# Patient Record
Sex: Female | Born: 2000
Health system: Southern US, Community
[De-identification: ages and names within clinical notes are randomized; demographics above are authoritative.]

## PROBLEM LIST (undated history)

## (undated) DIAGNOSIS — E119 Type 2 diabetes mellitus without complications: Secondary | ICD-10-CM

## (undated) DIAGNOSIS — L309 Dermatitis, unspecified: Secondary | ICD-10-CM

## (undated) HISTORY — PX: WISDOM TOOTH EXTRACTION: SHX21

## (undated) HISTORY — DX: Type 2 diabetes mellitus without complications: E11.9

---
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
## 2019-01-03 ENCOUNTER — Encounter (INDEPENDENT_AMBULATORY_CARE_PROVIDER_SITE_OTHER): Payer: Self-pay

## 2019-02-06 ENCOUNTER — Other Ambulatory Visit: Payer: Self-pay | Admitting: Family

## 2019-03-05 ENCOUNTER — Ambulatory Visit (INDEPENDENT_AMBULATORY_CARE_PROVIDER_SITE_OTHER): Payer: Managed Care, Other (non HMO) | Admitting: Family

## 2019-03-12 ENCOUNTER — Other Ambulatory Visit: Payer: Self-pay

## 2019-03-12 ENCOUNTER — Encounter (INDEPENDENT_AMBULATORY_CARE_PROVIDER_SITE_OTHER): Payer: Self-pay | Admitting: Family

## 2019-03-12 ENCOUNTER — Ambulatory Visit (INDEPENDENT_AMBULATORY_CARE_PROVIDER_SITE_OTHER): Payer: Managed Care, Other (non HMO) | Admitting: Family

## 2019-03-12 VITALS — Ht 58.54 in | Wt 112.2 lb

## 2019-03-12 DIAGNOSIS — E109 Type 1 diabetes mellitus without complications: Secondary | ICD-10-CM | POA: Diagnosis not present

## 2019-03-12 DIAGNOSIS — E10649 Type 1 diabetes mellitus with hypoglycemia without coma: Secondary | ICD-10-CM

## 2019-03-12 DIAGNOSIS — F4320 Adjustment disorder, unspecified: Secondary | ICD-10-CM | POA: Diagnosis not present

## 2019-03-12 DIAGNOSIS — R739 Hyperglycemia, unspecified: Secondary | ICD-10-CM

## 2019-03-12 LAB — POCT GLYCOSYLATED HEMOGLOBIN (HGB A1C): Hemoglobin A1C: 6.7 % — AB (ref 4.0–5.6)

## 2019-03-12 LAB — POCT GLUCOSE (DEVICE FOR HOME USE): POC Glucose: 143 mg/dl — AB (ref 70–99)

## 2019-03-12 NOTE — Patient Instructions (Signed)
-  Always have fast sugar with you in case of low blood sugar (glucose tabs, regular juice or soda, candy) -Always wear your ID that states you have diabetes -Always bring your meter to your visit -Call/Email if you want to review blood sugars   

## 2019-03-12 NOTE — Progress Notes (Signed)
Pediatric Endocrinology Diabetes Consultation Follow-up Visit  [name] 2001/05/27 [id number]  Chief Complaint: Follow-up type 1 diabetes   Patient, No Pcp Per   HPI: [name]  is a 18 y.o. female presenting for follow-up of type 1 diabetes. she is accompanied to this visit by her mother and father.  [place] presented to Urgent care on 03/19/15 after having vomiting. She had a 2-3 week history of polyuria and polydipsia. She was also more tired for weeks prior to her admission and had been losing weight. She was transferred to [facility] with a glucose of >600 and pH of 6.895. She was treated with the 2 bag method. She has no other family members with type 1 diabetes. Mom has thyroid disease. She was discharged on 03/23/15 on Lantus and Novolog. Her pancreatic islet cell antibodies were positive.   2. Since last visit to [facility] on 10/2018, she has been well.  No ER visits or hospitalizations.  She has been busy, she recently got wisdom teeth taken out and did well. She has been thinking a lot about getting insulin pump, trying to decide between Omnipod and Tslim. She is very happy with her Dexcom, she thinks her blood sugar have been really good. She recently started having low blood sugars around 1-3am. She does feel her low blood sugars.   During discussing hypoglycemia, she reports that she usually gives "extra insulin" at night when her blood sugars are high, she is not waiting 3 hours for the insulin to work.   Insulin regimen: 23 units of Basaglar. Humalog  120/30/8 plan  Hypoglycemia:Able to feel low blood sugars.  No glucagon needed recently.  Blood glucose download:  Dexcom CGM Download   - Avg Bg 143  - Target Range; in target 72%, above target 24% and below target 1%   - Pattern of hyperglycemia with rebound hypoglycemia between 8pm-3am.  Med-alert ID: Not currently wearing. Injection sites: arms and abdomen.  Annual labs due: 05/2019 Ophthalmology due: 2019--> Discussed  importance of dilated eye exam today.     3. ROS: Greater than 10 systems reviewed with pertinent positives listed in HPI, otherwise neg. Constitutional: Sleeping well. Weight stable.  Eyes: No changes in vision. She is due for eye exam. No blurry vision.  Ears/Nose/Mouth/Throat: No difficulty swallowing. Cardiovascular: No palpitations. No chest pain  Respiratory: No increased work of breathing Gastrointestinal: No constipation or diarrhea. No abdominal pain Genitourinary: No nocturia, no polyuria Neurologic: Normal sensation, no tremor Endocrine: No polydipsia.  No hyperpigmentation Psychiatric: Normal affect. Denies anxiety and depression.   Past Medical History:   Past Medical History:  Diagnosis Date  . Diabetes mellitus without complication ([place])   . Eczema     Medications:  Outpatient Encounter Medications as of 03/12/2019  Medication Sig  . acetone, urine, test strip 1 strip by Does not apply route as needed for high blood sugar.  . BD PEN NEEDLE NANO U/F 32G X 4 MM MISC USE TO INJECT INSULIN AS DIRECTED.  Marland Kitchen Blood Glucose Monitoring Suppl (ONETOUCH VERIO FLEX SYSTEM) w/Device KIT Use to check glucose  . cetirizine (ZYRTEC) 10 MG tablet Take 10 mg by mouth daily as needed for allergies. Reported on 07/22/2015  . glucagon (GLUCAGON EMERGENCY) 1 MG injection Inject 1 mg into the vein once as needed.  . Insulin Glargine (BASAGLAR KWIKPEN) 100 UNIT/ML SOPN INJECT UP TO 50 UNITS UNDER THE SKIN DAILY AND AS DIRECTED (90 day supply)  . insulin lispro (HUMALOG KWIKPEN) 100 UNIT/ML KwikPen INJECT UP  TO 50 UNITS PER DAY  . ONE TOUCH LANCETS MISC Use to check blood sugar up to six times a day.  [name] VERIO test strip CHECK BLOOD SUGAR 6 TIMES DAILY.  Marland Kitchen insulin glargine (LANTUS) 100 UNIT/ML injection Inject into the skin at bedtime.  . phenazopyridine (PYRIDIUM) 95 MG tablet Take 95 mg by mouth 3 (three) times daily as needed for pain.  . Vitamin D, Ergocalciferol, (DRISDOL) 50000  units CAPS capsule TAKE 1 CAPSULE EVERY WEEK BY MOUTH AS DIRECTED FOR 56 DAYS.   No facility-administered encounter medications on file as of 03/12/2019.     Allergies: Allergies  Allergen Reactions  . Apple Fiber Anaphylaxis    Swelling of face and throat when eats apple peelings  . Cherry     Throat and lip swelling     Surgical History: No past surgical history on file.  Family History:  Family History  Problem Relation Age of Onset  . Graves' disease Mother   . Hypertension Mother   . Hypertension Father       Social History: Lives with: Mother and father  Starting Cosmetology school in the Spring   Physical Exam:  Vitals:   03/12/19 1333  Weight: 112 lb 3.2 oz (50.9 kg)  Height: 4' 10.54" (1.487 m)   Ht 4' 10.54" (1.487 m)   Wt 112 lb 3.2 oz (50.9 kg)   LMP 03/05/2019 (Exact Date)   BMI 23.02 kg/m  Body mass index: body mass index is 23.02 kg/m. Blood pressure percentiles are not available for patients who are 18 years or older.  Ht Readings from Last 3 Encounters:  03/12/19 4' 10.54" (1.487 m) (1 %, Z= -2.25)*  11/27/18 4' 10.54" (1.487 m) (1 %, Z= -2.24)*  08/14/18 4' 10.54" (1.487 m) (1 %, Z= -2.24)*   * Growth percentiles are based on CDC (Girls, 2-20 Years) data.   Wt Readings from Last 3 Encounters:  03/12/19 112 lb 3.2 oz (50.9 kg) (19 %, Z= -0.87)*  11/27/18 112 lb (50.8 kg) (20 %, Z= -0.86)*  08/14/18 112 lb (50.8 kg) (20 %, Z= -0.82)*   * Growth percentiles are based on CDC (Girls, 2-20 Years) data.    Physical Exam.   General: Well developed, well nourished female in no acute distress.  Alert and oriented.  Head: Normocephalic, atraumatic.   Eyes:  Pupils equal and round. EOMI.   Sclera white.  No eye drainage.   Ears/Nose/Mouth/Throat: Nares patent, no nasal drainage.  Normal dentition, mucous membranes moist.   Neck: supple, no cervical lymphadenopathy, no thyromegaly Cardiovascular: regular rate, normal S1/S2, no  murmurs Respiratory: No increased work of breathing.  Lungs clear to auscultation bilaterally.  No wheezes. Abdomen: soft, nontender, nondistended. Normal bowel sounds.  No appreciable masses  Extremities: warm, well perfused, cap refill < 2 sec.   Musculoskeletal: Normal muscle mass.  Normal strength Skin: warm, dry.  No rash or lesions. Neurologic: alert and oriented, normal speech, no tremor   Labs:  Lab Results  Component Value Date   HGBA1C 6.7 (A) 03/12/2019   Results for orders placed or performed in visit on 03/12/19  POCT Glucose (Device for Home Use)  Result Value Ref Range   Glucose Fasting, POC     POC Glucose 143 (A) 70 - 99 mg/dl  POCT glycosylated hemoglobin (Hb A1C)  Result Value Ref Range   Hemoglobin A1C 6.7 (A) 4.0 - 5.6 %   HbA1c POC (<> result, manual entry)  HbA1c, POC (prediabetic range)     HbA1c, POC (controlled diabetic range)      Assessment/Plan: [name] is a 18 y.o. female with type 1 diabetes in improving control on CGM and MDI therapy. She is doing very well with diabetes care. Having rebound hypoglycemia due to stacking insulin at night, discussed in detail today. Would benefit from pump therapy. Hemoglobin A1c is 6.7% which meets ADA goal of <7%.  1-3. DM w/o complication type I, uncontrolled (HCC)/HyperglycemiaI/hypoglycemia/Elevated A1c .   - Decrease Basaglar to 23 units  - Humalog 120/30/8 plan with + 1 unit at dinner.  - Reviewed  CGM download. Discussed trends and patterns.  - Rotate injection sites to prevent scar tissue.  - bolus 15 minutes prior to eating to limit blood sugar spikes.  - Reviewed carb counting and importance of accurate carb counting.  - Discussed signs and symptoms of hypoglycemia. Always have glucose available.  - POCT glucose and hemoglobin A1c  - Reviewed growth chart.  - Discussed not to "stack" insulin. Rapid acting insulin takes 3-4 hours to exit system.    4. Adjustment Reaction  - Discussed concerns and  barrier to care - Answered questions.   Follow-up:   3 months.   I have spent >40 minutes with >50% of time in counseling, education and instruction. When a patient is on insulin, intensive monitoring of blood glucose levels is necessary to avoid hyperglycemia and hypoglycemia. Severe hyperglycemia/hypoglycemia can lead to hospital admissions and be life threatening.     Hermenia Bers,  FNP-C  Pediatric Specialist  [address] [place]  [place], [address]  Tele: [phone]

## 2019-03-13 ENCOUNTER — Other Ambulatory Visit (INDEPENDENT_AMBULATORY_CARE_PROVIDER_SITE_OTHER): Payer: Self-pay | Admitting: Family

## 2019-04-09 ENCOUNTER — Encounter (INDEPENDENT_AMBULATORY_CARE_PROVIDER_SITE_OTHER): Payer: Self-pay

## 2019-05-03 ENCOUNTER — Encounter (INDEPENDENT_AMBULATORY_CARE_PROVIDER_SITE_OTHER): Payer: Self-pay

## 2019-05-21 ENCOUNTER — Encounter (INDEPENDENT_AMBULATORY_CARE_PROVIDER_SITE_OTHER): Payer: Self-pay | Admitting: Family

## 2019-06-10 ENCOUNTER — Encounter (INDEPENDENT_AMBULATORY_CARE_PROVIDER_SITE_OTHER): Payer: Self-pay

## 2019-06-18 ENCOUNTER — Ambulatory Visit (INDEPENDENT_AMBULATORY_CARE_PROVIDER_SITE_OTHER): Payer: Managed Care, Other (non HMO) | Admitting: Family

## 2019-07-30 ENCOUNTER — Encounter (INDEPENDENT_AMBULATORY_CARE_PROVIDER_SITE_OTHER): Payer: Self-pay | Admitting: Family

## 2019-07-30 ENCOUNTER — Ambulatory Visit (INDEPENDENT_AMBULATORY_CARE_PROVIDER_SITE_OTHER): Payer: BC Managed Care – PPO | Admitting: Family

## 2019-07-30 VITALS — BP 118/64 | HR 88 | Ht 58.74 in | Wt 116.8 lb

## 2019-07-30 DIAGNOSIS — E559 Vitamin D deficiency, unspecified: Secondary | ICD-10-CM

## 2019-07-30 DIAGNOSIS — Z794 Long term (current) use of insulin: Secondary | ICD-10-CM | POA: Diagnosis not present

## 2019-07-30 DIAGNOSIS — E109 Type 1 diabetes mellitus without complications: Secondary | ICD-10-CM | POA: Diagnosis not present

## 2019-07-30 DIAGNOSIS — E10649 Type 1 diabetes mellitus with hypoglycemia without coma: Secondary | ICD-10-CM

## 2019-07-30 DIAGNOSIS — R739 Hyperglycemia, unspecified: Secondary | ICD-10-CM

## 2019-07-30 LAB — POCT GLYCOSYLATED HEMOGLOBIN (HGB A1C): Hemoglobin A1C: 6.8 % — AB (ref 4.0–5.6)

## 2019-07-30 LAB — POCT GLUCOSE (DEVICE FOR HOME USE): POC Glucose: 264 mg/dl — AB (ref 70–99)

## 2019-07-30 MED ORDER — GVOKE HYPOPEN 2-PACK 1 MG/0.2ML ~~LOC~~ SOAJ: 1.0000 | SUBCUTANEOUS | 1 refills | Status: DC | PRN

## 2019-07-30 NOTE — Progress Notes (Signed)
Pediatric Endocrinology Diabetes Consultation Follow-up Visit  [place] Territo 04-Sep-2000 [id number]  Chief Complaint: Follow-up type 1 diabetes   Patient, No Pcp Per   HPI: [place]  is a 19 y.o. female presenting for follow-up of type 1 diabetes. she is accompanied to this visit by her mother and father.  [place] presented to Urgent care on 03/19/15 after having vomiting. She had a 2-3 week history of polyuria and polydipsia. She was also more tired for weeks prior to her admission and had been losing weight. She was transferred to [facility] with a glucose of >600 and pH of 6.895. She was treated with the 2 bag method. She has no other family members with type 1 diabetes. Mom has thyroid disease. She was discharged on 03/23/15 on Lantus and Novolog. Her pancreatic islet cell antibodies were positive.   2. Since last visit to [facility] on 03/2019, she has been well.  No ER visits or hospitalizations.  She is working at [employer] for almost 40 hours per week, it is going well. She is deciding where she wants to go to college. Using Dexcom CGM and MDI. She reports that it has been "really easy" with Dexcom.   Concerns:  - lows at night if she does not eat when getting home from work  -  Insulin regimen: 21 units of Basaglar. Humalog  120/30/8 plan  Hypoglycemia:Able to feel low blood sugars.  No glucagon needed recently.  Blood glucose download:  Dexcom CGM Download   - Avg Bg 162.   - Target range. In target 61%, above target 35% and below target 3%   - pattern of hypoglycemia between 1am-6am.  Med-alert ID: Not currently wearing. Injection sites: arms and abdomen.  Annual labs due: 05/2019 Ophthalmology due: 2019--> Discussed importance of dilated eye exam today.     3. ROS: Greater than 10 systems reviewed with pertinent positives listed in HPI, otherwise neg. Constitutional: Sleeping well. Weight stable.  Eyes: No changes in vision. She is due for eye exam. No blurry vision.   Ears/Nose/Mouth/Throat: No difficulty swallowing. Cardiovascular: No palpitations. No chest pain  Respiratory: No increased work of breathing Gastrointestinal: No constipation or diarrhea. No abdominal pain Genitourinary: No nocturia, no polyuria Neurologic: Normal sensation, no tremor Endocrine: No polydipsia.  No hyperpigmentation Psychiatric: Normal affect. Denies anxiety and depression.   Past Medical History:   Past Medical History:  Diagnosis Date  . Diabetes mellitus without complication ([place])   . Eczema     Medications:  Outpatient Encounter Medications as of 07/30/2019  Medication Sig  . acetone, urine, test strip 1 strip by Does not apply route as needed for high blood sugar.  . BD PEN NEEDLE NANO U/F 32G X 4 MM MISC USE TO INJECT INSULIN AS DIRECTED.  Marland Kitchen Blood Glucose Monitoring Suppl (ONETOUCH VERIO FLEX SYSTEM) w/Device KIT Use to check glucose  . cetirizine (ZYRTEC) 10 MG tablet Take 10 mg by mouth daily as needed for allergies. Reported on 07/22/2015  . glucagon (GLUCAGON EMERGENCY) 1 MG injection Inject 1 mg into the vein once as needed.  . Insulin Glargine (BASAGLAR KWIKPEN) 100 UNIT/ML SOPN INJECT UP TO 50 UNITS UNDER THE SKIN DAILY AND AS DIRECTED (90 day supply)  . insulin glargine (LANTUS) 100 UNIT/ML injection Inject into the skin at bedtime.  . insulin lispro (HUMALOG KWIKPEN) 100 UNIT/ML KwikPen INJECT UP TO 50 UNITS PER DAY  . ONE TOUCH LANCETS MISC Use to check blood sugar up to six times a day.  Marland Kitchen  ONETOUCH VERIO test strip CHECK BLOOD SUGAR 6 TIMES DAILY.  Marland Kitchen Glucagon (GVOKE HYPOPEN 2-PACK) 1 MG/0.2ML SOAJ Inject 1 Dose into the skin as needed.  . phenazopyridine (PYRIDIUM) 95 MG tablet Take 95 mg by mouth 3 (three) times daily as needed for pain.  . Vitamin D, Ergocalciferol, (DRISDOL) 50000 units CAPS capsule TAKE 1 CAPSULE EVERY WEEK BY MOUTH AS DIRECTED FOR 56 DAYS.   No facility-administered encounter medications on file as of 07/30/2019.     Allergies: Allergies  Allergen Reactions  . Apple Fiber Anaphylaxis    Swelling of face and throat when eats apple peelings  . Cherry     Throat and lip swelling     Surgical History: Past Surgical History:  Procedure Laterality Date  . WISDOM TOOTH EXTRACTION      Family History:  Family History  Problem Relation Age of Onset  . Graves' disease Mother   . Hypertension Mother   . Hypertension Father       Social History: Lives with: Mother and father  Starting Cosmetology school in the Spring   Physical Exam:  Vitals:   07/30/19 1053  BP: 118/64  Pulse: 88  Weight: 116 lb 12.8 oz (53 kg)  Height: 4' 10.74" (1.492 m)   BP 118/64   Pulse 88   Ht 4' 10.74" (1.492 m)   Wt 116 lb 12.8 oz (53 kg)   BMI 23.80 kg/m  Body mass index: body mass index is 23.8 kg/m. Blood pressure percentiles are not available for patients who are 18 years or older.  Ht Readings from Last 3 Encounters:  07/30/19 4' 10.74" (1.492 m) (1 %, Z= -2.17)*  03/12/19 4' 10.54" (1.487 m) (1 %, Z= -2.25)*  11/27/18 4' 10.54" (1.487 m) (1 %, Z= -2.24)*   * Growth percentiles are based on CDC (Girls, 2-20 Years) data.   Wt Readings from Last 3 Encounters:  07/30/19 116 lb 12.8 oz (53 kg) (27 %, Z= -0.61)*  03/12/19 112 lb 3.2 oz (50.9 kg) (19 %, Z= -0.87)*  11/27/18 112 lb (50.8 kg) (20 %, Z= -0.86)*   * Growth percentiles are based on CDC (Girls, 2-20 Years) data.    Physical Exam.   General: Well developed, well nourished female in no acute distress.  Alert and oriented.  Head: Normocephalic, atraumatic.   Eyes:  Pupils equal and round. EOMI.   Sclera white.  No eye drainage.   Ears/Nose/Mouth/Throat: Nares patent, no nasal drainage.  Normal dentition, mucous membranes moist.   Neck: supple, no cervical lymphadenopathy, no thyromegaly Cardiovascular: regular rate, normal S1/S2, no murmurs Respiratory: No increased work of breathing.  Lungs clear to auscultation bilaterally.  No  wheezes. Abdomen: soft, nontender, nondistended. Normal bowel sounds.  No appreciable masses  Extremities: warm, well perfused, cap refill < 2 sec.   Musculoskeletal: Normal muscle mass.  Normal strength Skin: warm, dry.  No rash or lesions. Neurologic: alert and oriented, normal speech, no tremor    Labs:  Lab Results  Component Value Date   HGBA1C 6.8 (A) 07/30/2019   Results for orders placed or performed in visit on 07/30/19  POCT glycosylated hemoglobin (Hb A1C)  Result Value Ref Range   Hemoglobin A1C 6.8 (A) 4.0 - 5.6 %   HbA1c POC (<> result, manual entry)     HbA1c, POC (prediabetic range)     HbA1c, POC (controlled diabetic range)    POCT Glucose (Device for Home Use)  Result Value Ref Range   Glucose  Fasting, POC     POC Glucose 264 (A) 70 - 99 mg/dl    Assessment/Plan: Jaretzi is a 19 y.o. female with type 1 diabetes in improving control on CGM and MDI therapy. She is having pattern of hypoglycemia overnight, will decrease basaglar dose. Hemoglobin A1c is 6.8%which meets ADA goal of <7%.   1-3. DM w/o complication type I, uncontrolled (HCC)/HyperglycemiaI/hypoglycemia/Elevated A1c .   - Decrease Basaglar to 19 units  - Humalog 120/30/8 plan with + 1 unit at dinner.  - Reviewed meter and CGM download. Discussed trends and patterns.  - Rotate injection sites to prevent scar tissue.  - Inject Novolog 15 minutes prior to eating to limit blood sugar spikes.  - Reviewed carb counting and importance of accurate carb counting.  - Discussed signs and symptoms of hypoglycemia. Always have glucose available.  - POCT glucose and hemoglobin A1c  - Reviewed growth chart.  - Discussed current and future insulin pump technology  - Lipid panel, TFTs and Microalbumin ordered  - GVOKE hypoglycemia kit ordered.   4. Adjustment Reaction  - Discussed concerns and barriers to care  - Answered questions.   5. Hypovitaminosis D  - Repeat 25 OH D level.  Follow-up:   3 months.    >45 spent today reviewing the medical chart, counseling the patient/family, and documenting today's visit.  When a patient is on insulin, intensive monitoring of blood glucose levels is necessary to avoid hyperglycemia and hypoglycemia. Severe hyperglycemia/hypoglycemia can lead to hospital admissions and be life threatening.     Hermenia Bers,  FNP-C  Pediatric Specialist  [address] [place]  [place], [address]  Tele: [phone]

## 2019-07-30 NOTE — Patient Instructions (Signed)
Decrease basaglar to 19 units   - Can go back up to 20 units if running higher   - Humalog log per plan   - -Always have fast sugar with you in case of low blood sugar (glucose tabs, regular juice or soda, candy) -Always wear your ID that states you have diabetes -Always bring your meter to your visit -Call/Email if you want to review blood sugars

## 2019-07-31 ENCOUNTER — Other Ambulatory Visit (INDEPENDENT_AMBULATORY_CARE_PROVIDER_SITE_OTHER): Payer: Self-pay | Admitting: Family

## 2019-07-31 LAB — T4, FREE: Free T4: 1 ng/dL (ref 0.8–1.4)

## 2019-07-31 LAB — TSH: TSH: 1.65 mIU/L

## 2019-07-31 LAB — LIPID PANEL
Cholesterol: 192 mg/dL (ref <200)
HDL: 60 mg/dL (ref >=50)
LDL Cholesterol (Calc): 108 mg/dL (calc) — ABNORMAL HIGH
Non-HDL Cholesterol (Calc): 132 mg/dL (calc) — ABNORMAL HIGH (ref <130)
Total CHOL/HDL Ratio: 3.2 (calc) (ref <5.0)
Triglycerides: 127 mg/dL (ref <150)

## 2019-07-31 LAB — MICROALBUMIN / CREATININE URINE RATIO
Creatinine, Urine: 150 mg/dL (ref 20–275)
Microalb Creat Ratio: 1 mcg/mg creat (ref <30)
Microalb, Ur: 0.2 mg/dL

## 2019-07-31 LAB — VITAMIN D 25 HYDROXY (VIT D DEFICIENCY, FRACTURES): Vit D, 25-Hydroxy: 13 ng/mL — ABNORMAL LOW (ref 30–100)

## 2019-07-31 MED ORDER — VITAMIN D (ERGOCALCIFEROL) 1.25 MG (50000 UNIT) PO CAPS: 50000.0000 [IU] | ORAL_CAPSULE | ORAL | 0 refills | Status: DC

## 2019-08-13 DIAGNOSIS — E109 Type 1 diabetes mellitus without complications: Secondary | ICD-10-CM | POA: Diagnosis not present

## 2019-08-13 DIAGNOSIS — Z794 Long term (current) use of insulin: Secondary | ICD-10-CM | POA: Diagnosis not present

## 2019-08-24 ENCOUNTER — Telehealth (INDEPENDENT_AMBULATORY_CARE_PROVIDER_SITE_OTHER): Payer: Self-pay | Admitting: Family

## 2019-08-24 ENCOUNTER — Encounter (INDEPENDENT_AMBULATORY_CARE_PROVIDER_SITE_OTHER): Payer: Self-pay

## 2019-08-24 MED ORDER — INSULIN GLARGINE 100 UNIT/ML ~~LOC~~ SOLN: SUBCUTANEOUS | 5 refills | Status: DC

## 2019-08-24 MED ORDER — NOVOLOG FLEXPEN 100 UNIT/ML ~~LOC~~ SOPN: PEN_INJECTOR | SUBCUTANEOUS | 5 refills | Status: DC

## 2019-08-24 NOTE — Telephone Encounter (Signed)
  Who's calling (name and relationship to patient) : Isaiah Serge- Father   Best contact number: [phone]  Provider they see: Gretchen Short   Reason for call: Dad call to advise that Adean will need a refill on insulin but [employer] will not cover Humalog or Basaglar. They will need to switch to something that is covered with the insurance company. Please advise what can be sent in for the patient. She is almost out of insulin     PRESCRIPTION REFILL ONLY  Name of prescription: Humalog, Basaglar  Pharmacy: [facility] [address]  [place]

## 2019-09-08 ENCOUNTER — Ambulatory Visit: Payer: Self-pay | Attending: Internal Medicine

## 2019-09-08 DIAGNOSIS — Z23 Encounter for immunization: Secondary | ICD-10-CM

## 2019-09-08 NOTE — Progress Notes (Signed)
   Covid-19 Vaccination Clinic  Name:  [name]    MRN: [id number] DOB: 2000-09-09  09/08/2019  [name] was observed post Covid-19 immunization for 15 minutes without incident. She was provided with Vaccine Information Sheet and instruction to access the V-Safe system.   [name] was instructed to call 911 with any severe reactions post vaccine: Marland Kitchen Difficulty breathing  . Swelling of face and throat  . A fast heartbeat  . A bad rash all over body  . Dizziness and weakness   Immunizations Administered    Name Date Dose VIS Date Route   Moderna COVID-19 Vaccine 09/08/2019  1:46 PM 0.5 mL 05/01/2019 Intramuscular   Manufacturer: Moderna   Lot: [id number]   NDC: 10211-173-56

## 2019-09-17 ENCOUNTER — Other Ambulatory Visit: Payer: Self-pay | Admitting: Family

## 2019-10-06 ENCOUNTER — Ambulatory Visit: Payer: Self-pay | Attending: Internal Medicine

## 2019-10-06 DIAGNOSIS — Z23 Encounter for immunization: Secondary | ICD-10-CM

## 2019-10-06 NOTE — Progress Notes (Signed)
   Covid-19 Vaccination Clinic  Name:  [name]    MRN: [id number] DOB: 08/15/2000  10/06/2019  [name] was observed post Covid-19 immunization for 15 minutes without incident. She was provided with Vaccine Information Sheet and instruction to access the V-Safe system.   [name] was instructed to call 911 with any severe reactions post vaccine: Marland Kitchen Difficulty breathing  . Swelling of face and throat  . A fast heartbeat  . A bad rash all over body  . Dizziness and weakness   Immunizations Administered    Name Date Dose VIS Date Route   Moderna COVID-19 Vaccine 10/06/2019 11:28 AM 0.5 mL 05/2019 Intramuscular   Manufacturer: Moderna   Lot: [id number]   NDC: 62263-335-45

## 2019-11-05 ENCOUNTER — Ambulatory Visit (INDEPENDENT_AMBULATORY_CARE_PROVIDER_SITE_OTHER): Payer: BC Managed Care – PPO | Admitting: Family

## 2019-11-05 NOTE — Progress Notes (Deleted)
Pediatric Endocrinology Diabetes Consultation Follow-up Visit  Audryna Wendt May 20, 2001 [id number]  Chief Complaint: Follow-up type 1 diabetes   Patient, No Pcp Per   HPI: Sidonie  is a 19 y.o. female presenting for follow-up of type 1 diabetes. she is accompanied to this visit by her mother and father.  Imperial presented to Urgent care on 03/19/15 after having vomiting. She had a 2-3 week history of polyuria and polydipsia. She was also more tired for weeks prior to her admission and had been losing weight. She was transferred to [facility] with a glucose of >600 and pH of 6.895. She was treated with the 2 bag method. She has no other family members with type 1 diabetes. Mom has thyroid disease. She was discharged on 03/23/15 on Lantus and Novolog. Her pancreatic islet cell antibodies were positive.   2. Since last visit to [facility] on 07/2019, she has been well.  No ER visits or hospitalizations.  She is working at [employer] for almost 40 hours per week, it is going well. She is deciding where she wants to go to college. Using Dexcom CGM and MDI. She reports that it has been "really easy" with Dexcom.   Concerns:  - lows at night if she does not eat when getting home from work  -  Insulin regimen: 21 units of Basaglar. Humalog  120/30/8 plan  Hypoglycemia:Able to feel low blood sugars.  No glucagon needed recently.  Blood glucose download:  Dexcom CGM Download   - Avg Bg 162.   - Target range. In target 61%, above target 35% and below target 3%   - pattern of hypoglycemia between 1am-6am.  Med-alert ID: Not currently wearing. Injection sites: arms and abdomen.  Annual labs due: 05/2019 Ophthalmology due: 2019--> Discussed importance of dilated eye exam today.     3. ROS: Greater than 10 systems reviewed with pertinent positives listed in HPI, otherwise neg. Constitutional: Sleeping well. Weight stable.  Eyes: No changes in vision. She is due for eye exam. No blurry vision.   Ears/Nose/Mouth/Throat: No difficulty swallowing. Cardiovascular: No palpitations. No chest pain  Respiratory: No increased work of breathing Gastrointestinal: No constipation or diarrhea. No abdominal pain Genitourinary: No nocturia, no polyuria Neurologic: Normal sensation, no tremor Endocrine: No polydipsia.  No hyperpigmentation Psychiatric: Normal affect. Denies anxiety and depression.   Past Medical History:   Past Medical History:  Diagnosis Date   Diabetes mellitus without complication ([place])    Eczema     Medications:  Outpatient Encounter Medications as of 11/05/2019  Medication Sig   acetone, urine, test strip 1 strip by Does not apply route as needed for high blood sugar.   BD PEN NEEDLE NANO 2ND GEN 32G X 4 MM MISC USE TO INJECT INSULIN AS DIRECTED.   Blood Glucose Monitoring Suppl ([place]) w/Device KIT Use to check glucose   cetirizine (ZYRTEC) 10 MG tablet Take 10 mg by mouth daily as needed for allergies. Reported on 07/22/2015   glucagon (GLUCAGON EMERGENCY) 1 MG injection Inject 1 mg into the vein once as needed.   Glucagon (GVOKE HYPOPEN 2-PACK) 1 MG/0.2ML SOAJ Inject 1 Dose into the skin as needed.   insulin aspart (NOVOLOG FLEXPEN) 100 UNIT/ML FlexPen Use up to 50 units daily   insulin glargine (LANTUS) 100 UNIT/ML injection Inject up to 50 units daily   insulin lispro (HUMALOG KWIKPEN) 100 UNIT/ML KwikPen INJECT UP TO 50 UNITS PER DAY   ONE TOUCH LANCETS MISC Use to check blood  sugar up to six times a day.   ONETOUCH VERIO test strip CHECK BLOOD SUGAR 6 TIMES DAILY.   phenazopyridine (PYRIDIUM) 95 MG tablet Take 95 mg by mouth 3 (three) times daily as needed for pain.   Vitamin D, Ergocalciferol, (DRISDOL) 1.25 MG (50000 UNIT) CAPS capsule Take 1 capsule (50,000 Units total) by mouth once a week.   No facility-administered encounter medications on file as of 11/05/2019.    Allergies: Allergies  Allergen Reactions   Apple  Fiber Anaphylaxis    Swelling of face and throat when eats apple peelings   Cherry     Throat and lip swelling     Surgical History: Past Surgical History:  Procedure Laterality Date   WISDOM TOOTH EXTRACTION      Family History:  Family History  Problem Relation Age of Onset   Graves' disease Mother    Hypertension Mother    Hypertension Father       Social History: Lives with: Mother and father  Starting Cosmetology school in the Spring   Physical Exam:  There were no vitals filed for this visit. There were no vitals taken for this visit. Body mass index: body mass index is unknown because there is no height or weight on file. Growth percentile SmartLinks can only be used for patients less than 76 years old.  Ht Readings from Last 3 Encounters:  07/30/19 4' 10.74" (1.492 m) (1 %, Z= -2.17)*  03/12/19 4' 10.54" (1.487 m) (1 %, Z= -2.25)*  11/27/18 4' 10.54" (1.487 m) (1 %, Z= -2.24)*   * Growth percentiles are based on CDC (Girls, 2-20 Years) data.   Wt Readings from Last 3 Encounters:  07/30/19 116 lb 12.8 oz (53 kg) (27 %, Z= -0.61)*  03/12/19 112 lb 3.2 oz (50.9 kg) (19 %, Z= -0.87)*  11/27/18 112 lb (50.8 kg) (20 %, Z= -0.86)*   * Growth percentiles are based on CDC (Girls, 2-20 Years) data.    Physical Exam.  General: Well developed, well nourished female in no acute distress.   Head: Normocephalic, atraumatic.   Eyes:  Pupils equal and round. EOMI.   Sclera white.  No eye drainage.   Ears/Nose/Mouth/Throat: Nares patent, no nasal drainage.  Normal dentition, mucous membranes moist.   Neck: supple, no cervical lymphadenopathy, no thyromegaly Cardiovascular: regular rate, normal S1/S2, no murmurs Respiratory: No increased work of breathing.  Lungs clear to auscultation bilaterally.  No wheezes. Abdomen: soft, nontender, nondistended. Normal bowel sounds.  No appreciable masses  Extremities: warm, well perfused, cap refill < 2 sec.   Musculoskeletal:  Normal muscle mass.  Normal strength Skin: warm, dry.  No rash or lesions. Neurologic: alert and oriented, normal speech, no tremor    Labs:  Lab Results  Component Value Date   HGBA1C 6.8 (A) 07/30/2019   Results for orders placed or performed in visit on 07/30/19  Lipid panel  Result Value Ref Range   Cholesterol 192 <200 mg/dL   HDL 60 > OR = 50 mg/dL   Triglycerides 127 <150 mg/dL   LDL Cholesterol (Calc) 108 (H) mg/dL (calc)   Total CHOL/HDL Ratio 3.2 <5.0 (calc)   Non-HDL Cholesterol (Calc) 132 (H) <130 mg/dL (calc)  Microalbumin / creatinine urine ratio  Result Value Ref Range   Creatinine, Urine 150 20 - 275 mg/dL   Microalb, Ur 0.2 mg/dL   Microalb Creat Ratio 1 <30 mcg/mg creat  TSH  Result Value Ref Range   TSH 1.65 mIU/L  T4,  free  Result Value Ref Range   Free T4 1.0 0.8 - 1.4 ng/dL  VITAMIN D 25 Hydroxy (Vit-D Deficiency, Fractures)  Result Value Ref Range   Vit D, 25-Hydroxy 13 (L) 30 - 100 ng/mL  POCT glycosylated hemoglobin (Hb A1C)  Result Value Ref Range   Hemoglobin A1C 6.8 (A) 4.0 - 5.6 %   HbA1c POC (<> result, manual entry)     HbA1c, POC (prediabetic range)     HbA1c, POC (controlled diabetic range)    POCT Glucose (Device for Home Use)  Result Value Ref Range   Glucose Fasting, POC     POC Glucose 264 (A) 70 - 99 mg/dl    Assessment/Plan: Jessice is a 19 y.o. female with type 1 diabetes in improving control on CGM and MDI therapy. She is having pattern of hypoglycemia overnight, will decrease basaglar dose. Hemoglobin A1c is 6.8%which meets ADA goal of <7%.   1-3. DM w/o complication type I, uncontrolled (HCC)/HyperglycemiaI/hypoglycemia/Elevated A1c .   - Decrease Basaglar to 19 units  - Humalog 120/30/8 plan with + 1 unit at dinner.  - Reviewed meter and CGM download. Discussed trends and patterns.  - Rotate injection sites to prevent scar tissue.  - bolus 15 minutes prior to eating to limit blood sugar spikes.  - Reviewed carb  counting and importance of accurate carb counting.  - Discussed signs and symptoms of hypoglycemia. Always have glucose available.  - POCT glucose and hemoglobin A1c  - Reviewed growth chart.    4. Adjustment Reaction  - Discussed concerns and barriers to care  - Answered questions.   5. Hypovitaminosis D  - 2000 units of vitamin D3 daily   Follow-up:   3 months.   >45 spent today reviewing the medical chart, counseling the patient/family, and documenting today's visit.  When a patient is on insulin, intensive monitoring of blood glucose levels is necessary to avoid hyperglycemia and hypoglycemia. Severe hyperglycemia/hypoglycemia can lead to hospital admissions and be life threatening.     Hermenia Bers,  FNP-C  Pediatric Specialist  [address] [place]  [place], [address]  Tele: [phone]

## 2019-11-08 ENCOUNTER — Encounter (INDEPENDENT_AMBULATORY_CARE_PROVIDER_SITE_OTHER): Payer: Self-pay

## 2019-11-11 ENCOUNTER — Other Ambulatory Visit: Payer: Self-pay

## 2019-11-11 ENCOUNTER — Ambulatory Visit (HOSPITAL_COMMUNITY)
Admission: EM | Admit: 2019-11-11 | Discharge: 2019-11-11 | Disposition: A | Discharge status: Home / Self Care | Payer: BC Managed Care – PPO | Attending: Emergency Medicine | Admitting: Emergency Medicine

## 2019-11-11 DIAGNOSIS — S93602A Unspecified sprain of left foot, initial encounter: Secondary | ICD-10-CM | POA: Diagnosis not present

## 2019-11-11 MED ORDER — IBUPROFEN 800 MG PO TABS: 800.0000 mg | ORAL_TABLET | Freq: Three times a day (TID) | ORAL | 0 refills | Status: DC

## 2019-11-11 NOTE — ED Provider Notes (Signed)
[facility]    CSN: [id number] Arrival date & time: 11/11/19  1409      History   Chief Complaint Chief Complaint  Patient presents with  . Foot Pain    HPI [name] is a 19 y.o. female presenting today for evaluation of foot/ankle pain.  Patient reports earlier today she stood up from a seated position and felt pain shoot through her foot ankle and lower leg.  She denies any abnormal placement rolling of foot/ankle.  She denies prior injury/trauma to this area.  Pain mainly with weightbearing.  Denies numbness or tingling.  She also expresses concern as she feels she often experiences pain after very minimal trauma to various places in her lower extremities, but prominently will have brief episodes of pain in her hips.  Denies nighttime pain.  Denies fevers.  Denies family history of rheumatology/autoimmune disease.  HPI  Past Medical History:  Diagnosis Date  . Diabetes mellitus without complication ([place])   . Eczema     Patient Active Problem List   Diagnosis Date Noted  . Hypoglycemia due to type 1 diabetes mellitus ([place]) 11/27/2018  . Hyperglycemia 09/01/2017  . Insulin dose changed ([place]) 09/01/2017  . Maladaptive health behaviors affecting medical condition 08/01/2017  . Elevated hemoglobin A1c 08/01/2017  . Elevated blood pressure 04/17/2015  . Adjustment reaction of adult life     Past Surgical History:  Procedure Laterality Date  . WISDOM TOOTH EXTRACTION      OB History   No obstetric history on file.      Home Medications    Prior to Admission medications   Medication Sig Start Date End Date Taking? Authorizing Provider  insulin aspart (NOVOLOG FLEXPEN) 100 UNIT/ML FlexPen Use up to 50 units daily 08/24/19  Yes Hermenia Bers, NP  insulin glargine (LANTUS) 100 UNIT/ML injection Inject up to 50 units daily 08/24/19  Yes Hermenia Bers, NP  Vitamin D, Ergocalciferol, (DRISDOL) 1.25 MG (50000 UNIT) CAPS capsule Take 1 capsule (50,000  Units total) by mouth once a week. 07/31/19  Yes Hermenia Bers, NP  acetone, urine, test strip 1 strip by Does not apply route as needed for high blood sugar. 03/22/15   Verner Mould, MD  BD PEN NEEDLE NANO 2ND GEN 32G X 4 MM MISC USE TO INJECT INSULIN AS DIRECTED. 09/18/19   Hermenia Bers, NP  Blood Glucose Monitoring Suppl (ONETOUCH VERIO FLEX SYSTEM) w/Device KIT Use to check glucose 11/18/17   Hermenia Bers, NP  cetirizine (ZYRTEC) 10 MG tablet Take 10 mg by mouth daily as needed for allergies. Reported on 07/22/2015    [provider]  glucagon (GLUCAGON EMERGENCY) 1 MG injection Inject 1 mg into the vein once as needed. 02/20/18   Hermenia Bers, NP  Glucagon (GVOKE HYPOPEN 2-PACK) 1 MG/0.2ML SOAJ Inject 1 Dose into the skin as needed. 07/30/19   Hermenia Bers, NP  ibuprofen (ADVIL) 800 MG tablet Take 1 tablet (800 mg total) by mouth 3 (three) times daily. 11/11/19   Natha Guin C, PA-C  insulin lispro (HUMALOG KWIKPEN) 100 UNIT/ML KwikPen INJECT UP TO 50 UNITS PER DAY 03/13/19   Hermenia Bers, NP  ONE TOUCH LANCETS MISC Use to check blood sugar up to six times a day. 03/22/15   Verner Mould, MD  ONETOUCH VERIO test strip CHECK BLOOD SUGAR 6 TIMES DAILY. 11/30/18   Hermenia Bers, NP  phenazopyridine (PYRIDIUM) 95 MG tablet Take 95 mg by mouth 3 (three) times daily as needed for  pain.    [provider]    Family History Family History  Problem Relation Age of Onset  . Graves' disease Mother   . Hypertension Mother   . Hypertension Father     Social History Social History   Tobacco Use  . Smoking status: Passive Smoke Exposure - Never Smoker  . Smokeless tobacco: Never Used  Substance Use Topics  . Alcohol use: No  . Drug use: No     Allergies   Apple fiber and Cherry   Review of Systems Review of Systems  Constitutional: Negative for fatigue and fever.  Eyes: Negative for visual disturbance.  Respiratory:  Negative for shortness of breath.   Cardiovascular: Negative for chest pain.  Gastrointestinal: Negative for abdominal pain, nausea and vomiting.  Musculoskeletal: Positive for arthralgias and gait problem. Negative for joint swelling.  Skin: Negative for color change, rash and wound.  Neurological: Negative for dizziness, weakness, light-headedness and headaches.     Physical Exam Triage Vital Signs ED Triage Vitals [11/11/19 1433]  Enc Vitals Group     BP 127/72     Pulse Rate 65     Resp 18     Temp 98.2 F (36.8 C)     Temp Source Oral     SpO2 98 %     Weight      Height      Head Circumference      Peak Flow      Pain Score 4     Pain Loc      Pain Edu?      Excl. in [place]?    No data found.  Updated Vital Signs BP 127/72 (BP Location: Right Arm)   Pulse 65   Temp 98.2 F (36.8 C) (Oral)   Resp 18   LMP 11/10/2019 (Exact Date)   SpO2 98%   Visual Acuity Right Eye Distance:   Left Eye Distance:   Bilateral Distance:    Right Eye Near:   Left Eye Near:    Bilateral Near:     Physical Exam Vitals and nursing note reviewed.  Constitutional:      Appearance: She is well-developed.     Comments: No acute distress  HENT:     Head: Normocephalic and atraumatic.     Nose: Nose normal.  Eyes:     Conjunctiva/sclera: Conjunctivae normal.  Cardiovascular:     Rate and Rhythm: Normal rate.  Pulmonary:     Effort: Pulmonary effort is normal. No respiratory distress.  Abdominal:     General: There is no distension.  Musculoskeletal:        General: Normal range of motion.     Cervical back: Neck supple.     Comments: Left ankle/foot: No obvious swelling deformity or discoloration, nontender to palpation of medial malleolus, lateral malleolus, anterior ankle extending into dorsum of foot, nontender throughout distal lower leg or Achilles.  Negative Thompson's.  Dorsalis pedis 2+  Ambulating with very mild antalgia.  Skin:    General: Skin is warm and dry.    Neurological:     Mental Status: She is alert and oriented to person, place, and time.      UC Treatments / Results  Labs (all labs ordered are listed, but only abnormal results are displayed) Labs Reviewed - No data to display  EKG   Radiology No results found.  Procedures Procedures (including critical care time)  Medications Ordered in UC Medications - No data to display  Initial Impression / Assessment and Plan / UC Course  I have reviewed the triage vital signs and the nursing notes.  Pertinent labs & imaging results that were available during my care of the patient were reviewed by me and considered in my medical decision making (see chart for details).     Minimal mechanism of injury, nontender to palpation, suspect most likely sprain.  Recommended conservative treatment with Ace wrap ice anti-inflammatories and elevation.Discussed strict return precautions. Patient verbalized understanding and is agreeable with plan.   Final Clinical Impressions(s) / UC Diagnoses   Final diagnoses:  Foot sprain, left, initial encounter     Discharge Instructions     Use anti-inflammatories for pain/swelling. You may take up to 800 mg Ibuprofen every 8 hours with food. You may supplement Ibuprofen with Tylenol [id number] mg every 8 hours.   Ice and elevate  Follow up if not improving   ED Prescriptions    Medication Sig Dispense Auth. Provider   ibuprofen (ADVIL) 800 MG tablet Take 1 tablet (800 mg total) by mouth 3 (three) times daily. 21 tablet Latavius Capizzi, [place] C, PA-C     PDMP not reviewed this encounter.   Janith Lima, [place] 11/11/19 2115

## 2019-11-11 NOTE — ED Triage Notes (Signed)
Pt presents today with foot pain that started yesterday. Pt states that she went from seated to standing and "felt like her foot snapped in half". Pt denies any relieving factors. Pt states pain is worse with ambulation. Pt denies any obvious trauma or injury. Pt has full ROM. Pt ambulated with even steady gait into treatment area.

## 2019-11-11 NOTE — Discharge Instructions (Signed)
Use anti-inflammatories for pain/swelling. You may take up to 800 mg Ibuprofen every 8 hours with food. You may supplement Ibuprofen with Tylenol 500-1000 mg every 8 hours.   Ice and elevate  Follow up if not improving 

## 2019-11-12 ENCOUNTER — Ambulatory Visit (INDEPENDENT_AMBULATORY_CARE_PROVIDER_SITE_OTHER): Payer: BC Managed Care – PPO | Admitting: Family

## 2019-11-13 DIAGNOSIS — E109 Type 1 diabetes mellitus without complications: Secondary | ICD-10-CM | POA: Diagnosis not present

## 2019-11-13 DIAGNOSIS — Z794 Long term (current) use of insulin: Secondary | ICD-10-CM | POA: Diagnosis not present

## 2019-11-15 ENCOUNTER — Other Ambulatory Visit (INDEPENDENT_AMBULATORY_CARE_PROVIDER_SITE_OTHER): Payer: Self-pay | Admitting: Family

## 2019-11-17 ENCOUNTER — Encounter (INDEPENDENT_AMBULATORY_CARE_PROVIDER_SITE_OTHER): Payer: Self-pay

## 2019-11-19 ENCOUNTER — Other Ambulatory Visit (INDEPENDENT_AMBULATORY_CARE_PROVIDER_SITE_OTHER): Payer: Self-pay | Admitting: Family

## 2019-12-05 ENCOUNTER — Other Ambulatory Visit (INDEPENDENT_AMBULATORY_CARE_PROVIDER_SITE_OTHER): Payer: Self-pay

## 2019-12-05 DIAGNOSIS — E109 Type 1 diabetes mellitus without complications: Secondary | ICD-10-CM

## 2019-12-05 MED ORDER — INSULIN ASPART 100 UNIT/ML ~~LOC~~ SOLN: SUBCUTANEOUS | 1 refills | Status: DC

## 2019-12-05 NOTE — Telephone Encounter (Signed)
Received fax for Tandem Tslim pump start up, returned fax with requested information.  Novolog order requested for pump start up has been submitted to pharmacy.  Called patient to let her know, left HIPAA approved voicemail for return phone call.

## 2019-12-13 DIAGNOSIS — B373 Candidiasis of vulva and vagina: Secondary | ICD-10-CM | POA: Diagnosis not present

## 2019-12-13 DIAGNOSIS — N898 Other specified noninflammatory disorders of vagina: Secondary | ICD-10-CM | POA: Diagnosis not present

## 2019-12-13 DIAGNOSIS — N760 Acute vaginitis: Secondary | ICD-10-CM | POA: Diagnosis not present

## 2019-12-13 DIAGNOSIS — Z113 Encounter for screening for infections with a predominantly sexual mode of transmission: Secondary | ICD-10-CM | POA: Diagnosis not present

## 2019-12-16 ENCOUNTER — Other Ambulatory Visit (INDEPENDENT_AMBULATORY_CARE_PROVIDER_SITE_OTHER): Payer: Self-pay | Admitting: Family

## 2019-12-25 ENCOUNTER — Encounter (INDEPENDENT_AMBULATORY_CARE_PROVIDER_SITE_OTHER): Payer: Self-pay

## 2019-12-31 ENCOUNTER — Other Ambulatory Visit: Payer: Self-pay

## 2019-12-31 ENCOUNTER — Encounter (INDEPENDENT_AMBULATORY_CARE_PROVIDER_SITE_OTHER): Payer: Self-pay | Admitting: Family

## 2019-12-31 ENCOUNTER — Ambulatory Visit (INDEPENDENT_AMBULATORY_CARE_PROVIDER_SITE_OTHER): Payer: BC Managed Care – PPO | Admitting: Family

## 2019-12-31 VITALS — BP 118/60 | HR 92 | Wt 116.8 lb

## 2019-12-31 DIAGNOSIS — E10649 Type 1 diabetes mellitus with hypoglycemia without coma: Secondary | ICD-10-CM

## 2019-12-31 DIAGNOSIS — E109 Type 1 diabetes mellitus without complications: Secondary | ICD-10-CM

## 2019-12-31 DIAGNOSIS — F4320 Adjustment disorder, unspecified: Secondary | ICD-10-CM

## 2019-12-31 DIAGNOSIS — E559 Vitamin D deficiency, unspecified: Secondary | ICD-10-CM

## 2019-12-31 DIAGNOSIS — R7309 Other abnormal glucose: Secondary | ICD-10-CM | POA: Diagnosis not present

## 2019-12-31 DIAGNOSIS — R739 Hyperglycemia, unspecified: Secondary | ICD-10-CM | POA: Diagnosis not present

## 2019-12-31 LAB — POCT GLUCOSE (DEVICE FOR HOME USE): POC Glucose: 110 mg/dl — AB (ref 70–99)

## 2019-12-31 LAB — POCT GLYCOSYLATED HEMOGLOBIN (HGB A1C): Hemoglobin A1C: 7.1 % — AB (ref 4.0–5.6)

## 2019-12-31 NOTE — Patient Instructions (Addendum)
-  Always have fast sugar with you in case of low blood sugar (glucose tabs, regular juice or soda, candy) -Always wear your ID that states you have diabetes -Always bring your meter to your visit -Call/Email if you want to review blood sugars  12AM 0.73  6am 2pm 0.80 0.73 (new)   5pm 0.80  10pm 0.70        Insulin to Carbohydrate Ratio 12AM 10  6am 2pm 8 9 (new)   5pm 7--> 8.5   10pm 10        Insulin Sensitivity Factor 12AM 40  6am 2pm 30--> 35  38   5pm 30--> 38  10pm 40

## 2019-12-31 NOTE — Progress Notes (Signed)
Pediatric Endocrinology Diabetes Consultation Follow-up Visit  [name] 06/30/2000 [id number]  Chief Complaint: Follow-up type 1 diabetes   Patient, No Pcp Per   HPI: [name]  is a 19 y.o. female presenting for follow-up of type 1 diabetes. she is accompanied to this visit by her mother and father.  [name] presented to Urgent care on 03/19/15 after having vomiting. She had a 2-3 week history of polyuria and polydipsia. She was also more tired for weeks prior to her admission and had been losing weight. She was transferred to [facility] with a glucose of >600 and pH of 6.895. She was treated with the 2 bag method. She has no other family members with type 1 diabetes. Mom has thyroid disease. She was discharged on 03/23/15 on Lantus and Novolog. Her pancreatic islet cell antibodies were positive.   2. Since last visit to [facility] on 07/2019 , she has been well.  No ER visits or hospitalizations.  Staying busy with work and exercising. She started Tslim insulin pump about one week ago and feels like it is working very well for her. She "loves it" and things are easier. She was initially having hypoglycemia at night but it has resolved. She is using Dexcom CGM, working well.   Concerns:  Hypoglycemia during work between 2pm-10pm   Insulin regimen: 21 units of Basaglar. Humalog  120/30/8 plan.. TSLIM insulin pump Basal Rates 12AM 0.73  6am 0.80  5pm 0.80  10pm 0.70        Insulin to Carbohydrate Ratio 12AM 10  6am 8  5pm 7  10pm 10        Insulin Sensitivity Factor 12AM 40  6am 30  5pm 30  10pm 40       Target Blood Glucose 12AM 120                 Hypoglycemia:Able to feel low blood sugars.  No glucagon needed recently.  Blood glucose download:  Dexcom CGM Download   Med-alert ID: Not currently wearing. Injection sites: arms and abdomen.  Annual labs due: 05/2019 Ophthalmology due: 2019--> Discussed importance of dilated eye exam today.     3. ROS: Greater  than 10 systems reviewed with pertinent positives listed in HPI, otherwise neg. Constitutional: Sleeping well. Weight stable.  Eyes: No changes in vision. She is due for eye exam. No blurry vision.  Ears/Nose/Mouth/Throat: No difficulty swallowing. Cardiovascular: No palpitations. No chest pain  Respiratory: No increased work of breathing Gastrointestinal: No constipation or diarrhea. No abdominal pain Genitourinary: No nocturia, no polyuria Neurologic: Normal sensation, no tremor Endocrine: No polydipsia.  No hyperpigmentation Psychiatric: Normal affect. Denies anxiety and depression.   Past Medical History:   Past Medical History:  Diagnosis Date  . Diabetes mellitus without complication ([place])   . Eczema     Medications:  Outpatient Encounter Medications as of 12/31/2019  Medication Sig Note  . ibuprofen (ADVIL) 800 MG tablet Take 1 tablet (800 mg total) by mouth 3 (three) times daily.   . insulin aspart (NOVOLOG) 100 UNIT/ML injection Inject up to 300 units into pump every 48 hours   . acetone, urine, test strip 1 strip by Does not apply route as needed for high blood sugar. (Patient not taking: Reported on 12/31/2019) 12/31/2019: PRN  . BD PEN NEEDLE NANO 2ND GEN 32G X 4 MM MISC USE TO INJECT INSULIN AS DIRECTED. (Patient not taking: Reported on 12/31/2019) 12/31/2019: PRN pump failure  . Blood Glucose Monitoring Suppl (ONETOUCH VERIO FLEX  SYSTEM) w/Device KIT Use to check glucose (Patient not taking: Reported on 12/31/2019)   . cetirizine (ZYRTEC) 10 MG tablet Take 10 mg by mouth daily as needed for allergies. Reported on 07/22/2015 (Patient not taking: Reported on 12/31/2019)   . glucagon (GLUCAGON EMERGENCY) 1 MG injection Inject 1 mg into the vein once as needed. (Patient not taking: Reported on 12/31/2019)   . Glucagon (GVOKE HYPOPEN 2-PACK) 1 MG/0.2ML SOAJ Inject 1 Dose into the skin as needed. (Patient not taking: Reported on 12/31/2019) 12/31/2019: PRN emergencies  . insulin aspart (NOVOLOG  FLEXPEN) 100 UNIT/ML FlexPen Use up to 50 units daily (Patient not taking: Reported on 12/31/2019) 12/31/2019: PRN pump failure  . insulin glargine (LANTUS) 100 UNIT/ML injection Inject up to 50 units daily (Patient not taking: Reported on 12/31/2019) 12/31/2019: PRN pump failure  . insulin lispro (HUMALOG KWIKPEN) 100 UNIT/ML KwikPen INJECT UP TO 50 UNITS PER DAY (Patient not taking: Reported on 12/31/2019)   . ONE TOUCH LANCETS MISC Use to check blood sugar up to six times a day. (Patient not taking: Reported on 12/31/2019)   . ONETOUCH VERIO test strip CHECK BLOOD SUGAR 6 TIMES DAILY. (Patient not taking: Reported on 12/31/2019)   . phenazopyridine (PYRIDIUM) 95 MG tablet Take 95 mg by mouth 3 (three) times daily as needed for pain. (Patient not taking: Reported on 12/31/2019)   . Vitamin D, Ergocalciferol, (DRISDOL) 1.25 MG (50000 UNIT) CAPS capsule Take 1 capsule (50,000 Units total) by mouth once a week. (Patient not taking: Reported on 12/31/2019)    No facility-administered encounter medications on file as of 12/31/2019.    Allergies: Allergies  Allergen Reactions  . Apple Fiber Anaphylaxis    Swelling of face and throat when eats apple peelings  . Cherry     Throat and lip swelling     Surgical History: Past Surgical History:  Procedure Laterality Date  . WISDOM TOOTH EXTRACTION      Family History:  Family History  Problem Relation Age of Onset  . Graves' disease Mother   . Hypertension Mother   . Hypertension Father       Social History: Lives with: Mother and father  Starting Cosmetology school in the Spring   Physical Exam:  Vitals:   12/31/19 1120  BP: 118/60  Pulse: 92  Weight: 116 lb 12.8 oz (53 kg)   BP 118/60   Pulse 92   Wt 116 lb 12.8 oz (53 kg)   LMP 12/14/2019   BMI 23.80 kg/m  Body mass index: body mass index is 23.8 kg/m. Growth percentile SmartLinks can only be used for patients less than 4 years old.  Ht Readings from Last 3 Encounters:  07/30/19 4'  10.74" (1.492 m) (1 %, Z= -2.17)*  03/12/19 4' 10.54" (1.487 m) (1 %, Z= -2.25)*  11/27/18 4' 10.54" (1.487 m) (1 %, Z= -2.24)*   * Growth percentiles are based on CDC (Girls, 2-20 Years) data.   Wt Readings from Last 3 Encounters:  12/31/19 116 lb 12.8 oz (53 kg)  07/30/19 116 lb 12.8 oz (53 kg) (27 %, Z= -0.61)*  03/12/19 112 lb 3.2 oz (50.9 kg) (19 %, Z= -0.87)*   * Growth percentiles are based on CDC (Girls, 2-20 Years) data.    Physical Exam.  General: Well developed, well nourished female in no acute distress.  Head: Normocephalic, atraumatic.   Eyes:  Pupils equal and round. EOMI.   Sclera white.  No eye drainage.   Ears/Nose/Mouth/Throat: Nares patent,  no nasal drainage.  Normal dentition, mucous membranes moist.   Neck: supple, no cervical lymphadenopathy, no thyromegaly Cardiovascular: regular rate, normal S1/S2, no murmurs Respiratory: No increased work of breathing.  Lungs clear to auscultation bilaterally.  No wheezes. Abdomen: soft, nontender, nondistended. Normal bowel sounds.  No appreciable masses  Extremities: warm, well perfused, cap refill < 2 sec.   Musculoskeletal: Normal muscle mass.  Normal strength Skin: warm, dry.  No rash or lesions. Neurologic: alert and oriented, normal speech, no tremor  Labs:  Lab Results  Component Value Date   HGBA1C 7.1 (A) 12/31/2019   Results for orders placed or performed in visit on 12/31/19  POCT Glucose (Device for Home Use)  Result Value Ref Range   Glucose Fasting, POC     POC Glucose 110 (A) 70 - 99 mg/dl  POCT glycosylated hemoglobin (Hb A1C)  Result Value Ref Range   Hemoglobin A1C 7.1 (A) 4.0 - 5.6 %   HbA1c POC (<> result, manual entry)     HbA1c, POC (prediabetic range)     HbA1c, POC (controlled diabetic range)      Assessment/Plan: Sarh is a 19 y.o. female with type 1 diabetes recently started on TSlim insulin pump. She is having pattern of hypoglycemia while at work. Will decrease basal rates,  correction factors and carb ratio. Hemoglobin A1c is 7.1% today.   1-3. DM w/o complication type I, uncontrolled (HCC)/HyperglycemiaI/hypoglycemia/Elevated A1c .   - Reviewed insulin pump and CGM download. Discussed trends and patterns.  - Rotate pump sites to prevent scar tissue.  - bolus 15 minutes prior to eating to limit blood sugar spikes.  - Reviewed carb counting and importance of accurate carb counting.  - Discussed signs and symptoms of hypoglycemia. Always have glucose available.  - POCT glucose and hemoglobin A1c  - Reviewed growth chart.    4. Adjustment Reaction  - Discussed concerns and barriers to care  - Answered questions.   5. Hypovitaminosis D  - 2000 units of vitamin D per day   6 Insulin pump titration   12AM 0.73  6am 1pm 0.80  0.73 (new)   5pm 0.75  10pm 0.70        Insulin to Carbohydrate Ratio 12AM 10  6am 1pm 8 9 (new)   5pm 7--> 8.5   10pm 10        Insulin Sensitivity Factor 12AM 40  6am 1pm 30--> 35  38   5pm 30--> 38  10pm 40    Follow-up:   3 months.   >45 spent today reviewing the medical chart, counseling the patient/family, and documenting today's visit.   When a patient is on insulin, intensive monitoring of blood glucose levels is necessary to avoid hyperglycemia and hypoglycemia. Severe hyperglycemia/hypoglycemia can lead to hospital admissions and be life threatening.     Hermenia Bers,  FNP-C  Pediatric Specialist  [address] [place]  [place], [address]  Tele: [phone]

## 2020-02-06 DIAGNOSIS — E109 Type 1 diabetes mellitus without complications: Secondary | ICD-10-CM | POA: Diagnosis not present

## 2020-02-06 DIAGNOSIS — Z794 Long term (current) use of insulin: Secondary | ICD-10-CM | POA: Diagnosis not present

## 2020-02-13 ENCOUNTER — Encounter (HOSPITAL_COMMUNITY): Payer: Self-pay | Admitting: *Deleted

## 2020-02-13 ENCOUNTER — Ambulatory Visit (INDEPENDENT_AMBULATORY_CARE_PROVIDER_SITE_OTHER): Payer: BC Managed Care – PPO

## 2020-02-13 ENCOUNTER — Ambulatory Visit (HOSPITAL_COMMUNITY)
Admission: EM | Admit: 2020-02-13 | Discharge: 2020-02-13 | Disposition: A | Discharge status: Home / Self Care | Payer: BC Managed Care – PPO | Attending: Family Medicine | Admitting: Family Medicine

## 2020-02-13 ENCOUNTER — Other Ambulatory Visit: Payer: Self-pay

## 2020-02-13 DIAGNOSIS — M79672 Pain in left foot: Secondary | ICD-10-CM

## 2020-02-13 MED ORDER — DICLOFENAC SODIUM 75 MG PO TBEC
75.0000 mg | DELAYED_RELEASE_TABLET | Freq: Two times a day (BID) | ORAL | 0 refills | Status: DC
Start: 2020-02-13 — End: 2023-12-12

## 2020-02-13 NOTE — ED Triage Notes (Signed)
Patient has complaints of pain to the top of left foot for the past couple of months. Patient denies any redness, injury or bruises to the left foot. Walking makes pain worse. Patient states that sometimes the pain feels like it is her knee, calf and ankle. Patient states she has not tried any over the counter medications to relieve pain.

## 2020-02-13 NOTE — ED Provider Notes (Signed)
[facility]   [id number] 02/13/20 Arrival Time: 1152  ASSESSMENT & PLAN:  1. Left foot pain     I have personally viewed the imaging studies ordered this visit. No foot fracture appreciated.  Begin trial of: Meds ordered this encounter  Medications  . diclofenac (VOLTAREN) 75 MG EC tablet    Sig: Take 1 tablet (75 mg total) by mouth 2 (two) times daily.    Dispense:  14 tablet    Refill:  0   CAM walker provided for comfort. Work note provided.   Recommend:  Follow-up Information    [facility].   Why: If worsening or failing to improve as anticipated. Contact information: [address] [place] [address] [phone]               Reviewed expectations re: course of current medical issues. Questions answered. Outlined signs and symptoms indicating need for more acute intervention. Patient verbalized understanding. After Visit Summary given.  SUBJECTIVE: History from: patient. [name] is a 19 y.o. female who reports fairly persistent dorsal left foot pain without injury/trauma.  Worse with prolonged standing/walking. Better with rest. No extremity sensation changes or weakness. No skin changes. No OTC tx. Occasional discomfort radiating to ankle/calf. Without calf swelling.   Past Surgical History:  Procedure Laterality Date  . WISDOM TOOTH EXTRACTION        OBJECTIVE:  Vitals:   02/13/20 1351  BP: 122/84  Pulse: 82  Resp: 16  Temp: 98.2 F (36.8 C)  TempSrc: Oral  SpO2: 100%    General appearance: alert; no distress HEENT: [place]; AT Neck: supple with FROM Resp: unlabored respirations Extremities: . LLE: warm with well perfused appearance; fairly well localized mild to moderate tenderness over left dorsal foot pain; without gross deformities; swelling: none; bruising: none; ankle ROM: normal CV: brisk extremity capillary refill of LLE; 2+ DP pulse of LLE. Skin: warm and dry; no  visible rashes Neurologic: gait normal; normal sensation and strength of LLE Psychological: alert and cooperative; normal mood and affect  Imaging: DG Foot Complete Left  Result Date: 02/13/2020 CLINICAL DATA:  Foot pain for 2 months, no known injury, initial encounter EXAM: LEFT FOOT - COMPLETE 3+ VIEW COMPARISON:  None. FINDINGS: There is no evidence of fracture or dislocation. There is no evidence of arthropathy or other focal bone abnormality. Soft tissues are unremarkable. IMPRESSION: No acute abnormality noted. Electronically Signed   By: Alcide Clever M.D.   On: 02/13/2020 14:25      Allergies  Allergen Reactions  . Apple Fiber Anaphylaxis    Swelling of face and throat when eats apple peelings  . Cherry     Throat and lip swelling     Past Medical History:  Diagnosis Date  . Diabetes mellitus without complication (HCC)   . Eczema    Social History   Socioeconomic History  . Marital status: Single    Spouse name: Not on file  . Number of children: Not on file  . Years of education: Not on file  . Highest education level: Not on file  Occupational History  . Not on file  Tobacco Use  . Smoking status: Never Smoker  . Smokeless tobacco: Never Used  Vaping Use  . Vaping Use: Never used  Substance and Sexual Activity  . Alcohol use: No  . Drug use: No  . Sexual activity: Yes    Birth control/protection: None  Other Topics Concern  .  Not on file  Social History Narrative   Lives at home with mom dad and brother, attends [employer] in the [employer].    Social Determinants of Health   Financial Resource Strain:   . Difficulty of Paying Living Expenses: Not on file  Food Insecurity:   . Worried About Programme researcher, broadcasting/film/video in the Last Year: Not on file  . Ran Out of Food in the Last Year: Not on file  Transportation Needs:   . Lack of Transportation (Medical): Not on file  . Lack of Transportation (Non-Medical): Not on file  Physical Activity:   . Days of  Exercise per Week: Not on file  . Minutes of Exercise per Session: Not on file  Stress:   . Feeling of Stress : Not on file  Social Connections:   . Frequency of Communication with Friends and Family: Not on file  . Frequency of Social Gatherings with Friends and Family: Not on file  . Attends Religious Services: Not on file  . Active Member of Clubs or Organizations: Not on file  . Attends Banker Meetings: Not on file  . Marital Status: Not on file   Family History  Problem Relation Age of Onset  . Graves' disease Mother   . Hypertension Mother   . Hypertension Father    Past Surgical History:  Procedure Laterality Date  . WISDOM TOOTH EXTRACTION        Mardella Layman, MD 02/13/20 [phone]

## 2020-02-15 DIAGNOSIS — E109 Type 1 diabetes mellitus without complications: Secondary | ICD-10-CM | POA: Diagnosis not present

## 2020-02-15 DIAGNOSIS — Z794 Long term (current) use of insulin: Secondary | ICD-10-CM | POA: Diagnosis not present

## 2020-03-20 ENCOUNTER — Encounter (INDEPENDENT_AMBULATORY_CARE_PROVIDER_SITE_OTHER): Payer: Self-pay

## 2020-03-26 ENCOUNTER — Encounter (INDEPENDENT_AMBULATORY_CARE_PROVIDER_SITE_OTHER): Payer: Self-pay

## 2020-04-07 ENCOUNTER — Encounter (INDEPENDENT_AMBULATORY_CARE_PROVIDER_SITE_OTHER): Payer: Self-pay | Admitting: Family

## 2020-04-07 ENCOUNTER — Ambulatory Visit (INDEPENDENT_AMBULATORY_CARE_PROVIDER_SITE_OTHER): Payer: BC Managed Care – PPO | Admitting: Family

## 2020-04-07 ENCOUNTER — Other Ambulatory Visit: Payer: Self-pay

## 2020-04-07 VITALS — BP 108/70 | HR 88 | Wt 118.4 lb

## 2020-04-07 DIAGNOSIS — E559 Vitamin D deficiency, unspecified: Secondary | ICD-10-CM

## 2020-04-07 DIAGNOSIS — R739 Hyperglycemia, unspecified: Secondary | ICD-10-CM

## 2020-04-07 DIAGNOSIS — E1065 Type 1 diabetes mellitus with hyperglycemia: Secondary | ICD-10-CM | POA: Diagnosis not present

## 2020-04-07 DIAGNOSIS — E10649 Type 1 diabetes mellitus with hypoglycemia without coma: Secondary | ICD-10-CM

## 2020-04-07 DIAGNOSIS — Z4681 Encounter for fitting and adjustment of insulin pump: Secondary | ICD-10-CM

## 2020-04-07 DIAGNOSIS — E109 Type 1 diabetes mellitus without complications: Secondary | ICD-10-CM

## 2020-04-07 LAB — POCT GLYCOSYLATED HEMOGLOBIN (HGB A1C): Hemoglobin A1C: 5.9 % — AB (ref 4.0–5.6)

## 2020-04-07 LAB — POCT GLUCOSE (DEVICE FOR HOME USE): POC Glucose: 97 mg/dl (ref 70–99)

## 2020-04-07 NOTE — Patient Instructions (Signed)

## 2020-04-07 NOTE — Progress Notes (Signed)
Pediatric Endocrinology Diabetes Consultation Follow-up Visit  [name] [birth year] [id number]  Chief Complaint: Follow-up type 1 diabetes   Patient, No Pcp Per   HPI: [name]  is a 19 y.o. female presenting for follow-up of type 1 diabetes. she is accompanied to this visit by her mother and father.  [facility] presented to Urgent care on 03/19/15 after having vomiting. She had a 2-3 week history of polyuria and polydipsia. She was also more tired for weeks prior to her admission and had been losing weight. She was transferred to [facility] with a glucose of >600 and pH of 6.895. She was treated with the 2 bag method. She has no other family members with type 1 diabetes. Mom has thyroid disease. She was discharged on 03/23/15 on Lantus and Novolog. Her pancreatic islet cell antibodies were positive.   2. Since last visit to [facility] on 12/2019 , she has been well.  No ER visits or hospitalizations.  She has been having foot pain on her left foot for a couple of months. She had xray done which wsa normal but put her in a boot because they had concern about tendonitis from being on her feet so much at work.   Wearing a Tslim insulin pump and Dexcom CGM. She feels like things have been going very well. She thinks her main issue is that she is not eating as healthy as she would like. She is bolusing before eating. She is not having hypoglycemia as often at work any longer. She is also increasing exercise which she feels has helped improve blood sguars.   Insulin regimen: 21 units of Basaglar. Humalog  120/30/8 plan.. TSLIM insulin pump 12AM 0.73  6am 1pm 0.80  0.73   5pm 0.75  10pm 0.70        Insulin to Carbohydrate Ratio 12AM 10  6am 1pm 8 9   5pm 8.5   10pm 10         Insulin Sensitivity Factor 12AM 40  6am 30  5pm 30  10pm 40       Target Blood Glucose 12AM 120                 Hypoglycemia:Able to feel low blood sugars.  No glucagon needed recently.  Blood glucose  download:  Dexcom CGM Download   Med-alert ID: Not currently wearing. Injection sites: arms and abdomen.  Annual labs due: 05/2019 Ophthalmology due: 2019--> Discussed importance of dilated eye exam today.     3. ROS: Greater than 10 systems reviewed with pertinent positives listed in HPI, otherwise neg. Constitutional: Sleeping well. Weight stable.  Eyes: No changes in vision. She is due for eye exam. No blurry vision.  Ears/Nose/Mouth/Throat: No difficulty swallowing. Cardiovascular: No palpitations. No chest pain  Respiratory: No increased work of breathing Gastrointestinal: No constipation or diarrhea. No abdominal pain Genitourinary: No nocturia, no polyuria Neurologic: Normal sensation, no tremor Endocrine: No polydipsia.  No hyperpigmentation Psychiatric: Normal affect. Denies anxiety and depression.   Past Medical History:   Past Medical History:  Diagnosis Date  . Diabetes mellitus without complication ([place])   . Eczema     Medications:  Outpatient Encounter Medications as of 04/07/2020  Medication Sig Note  . insulin aspart (NOVOLOG) 100 UNIT/ML injection Inject up to 300 units into pump every 48 hours   . acetone, urine, test strip 1 strip by Does not apply route as needed for high blood sugar. 12/31/2019: PRN  . BD PEN NEEDLE NANO 2ND GEN  32G X 4 MM MISC USE TO INJECT INSULIN AS DIRECTED. 12/31/2019: PRN pump failure  . Blood Glucose Monitoring Suppl (ONETOUCH VERIO FLEX SYSTEM) w/Device KIT Use to check glucose   . diclofenac (VOLTAREN) 75 MG EC tablet Take 1 tablet (75 mg total) by mouth 2 (two) times daily.   Marland Kitchen glucagon (GLUCAGON EMERGENCY) 1 MG injection Inject 1 mg into the vein once as needed. (Patient not taking: Reported on 12/31/2019)   . Glucagon (GVOKE HYPOPEN 2-PACK) 1 MG/0.2ML SOAJ Inject 1 Dose into the skin as needed. (Patient not taking: Reported on 12/31/2019) 12/31/2019: PRN emergencies  . insulin aspart (NOVOLOG FLEXPEN) 100 UNIT/ML FlexPen Use up to 50 units  daily (Patient not taking: Reported on 12/31/2019) 12/31/2019: PRN pump failure  . insulin glargine (LANTUS) 100 UNIT/ML injection Inject up to 50 units daily (Patient not taking: Reported on 12/31/2019) 12/31/2019: PRN pump failure  . Insulin Human (INSULIN PUMP) SOLN Inject into the skin.   Marland Kitchen insulin lispro (HUMALOG KWIKPEN) 100 UNIT/ML KwikPen INJECT UP TO 50 UNITS PER DAY (Patient not taking: Reported on 12/31/2019)   . ONE TOUCH LANCETS MISC Use to check blood sugar up to six times a day. (Patient not taking: Reported on 12/31/2019)   . ONETOUCH VERIO test strip CHECK BLOOD SUGAR 6 TIMES DAILY.   Marland Kitchen Vitamin D, Ergocalciferol, (DRISDOL) 1.25 MG (50000 UNIT) CAPS capsule Take 1 capsule (50,000 Units total) by mouth once a week. (Patient not taking: Reported on 12/31/2019)   . [DISCONTINUED] cetirizine (ZYRTEC) 10 MG tablet Take 10 mg by mouth daily as needed for allergies. Reported on 07/22/2015 (Patient not taking: Reported on 12/31/2019)    No facility-administered encounter medications on file as of 04/07/2020.    Allergies: Allergies  Allergen Reactions  . Apple Fiber Anaphylaxis    Swelling of face and throat when eats apple peelings  . Cherry     Throat and lip swelling     Surgical History: Past Surgical History:  Procedure Laterality Date  . WISDOM TOOTH EXTRACTION      Family History:  Family History  Problem Relation Age of Onset  . Graves' disease Mother   . Hypertension Mother   . Hypertension Father       Social History: Lives with: Mother and father  Starting Cosmetology school in the Spring   Physical Exam:  Vitals:   04/07/20 1143  BP: 108/70  Pulse: 88  Weight: 118 lb 6.4 oz (53.7 kg)   BP 108/70   Pulse 88   Wt 118 lb 6.4 oz (53.7 kg)   BMI 24.13 kg/m  Body mass index: body mass index is 24.13 kg/m. Growth percentile SmartLinks can only be used for patients less than 41 years old.  Ht Readings from Last 3 Encounters:  07/30/19 4' 10.74" (1.492 m) (1 %, Z=  -2.17)*  03/12/19 4' 10.54" (1.487 m) (1 %, Z= -2.25)*  11/27/18 4' 10.54" (1.487 m) (1 %, Z= -2.24)*   * Growth percentiles are based on CDC (Girls, 2-20 Years) data.   Wt Readings from Last 3 Encounters:  04/07/20 118 lb 6.4 oz (53.7 kg)  12/31/19 116 lb 12.8 oz (53 kg)  07/30/19 116 lb 12.8 oz (53 kg) (27 %, Z= -0.61)*   * Growth percentiles are based on CDC (Girls, 2-20 Years) data.    Physical Exam.  General: Well developed, well nourished female in no acute distress.   Head: Normocephalic, atraumatic.   Eyes:  Pupils equal and round. EOMI.  Sclera white.  No eye drainage.   Ears/Nose/Mouth/Throat: Nares patent, no nasal drainage.  Normal dentition, mucous membranes moist. Neck: supple, no cervical lymphadenopathy, no thyromegaly Cardiovascular: regular rate, normal S1/S2, no murmurs Respiratory: No increased work of breathing.  Lungs clear to auscultation bilaterally.  No wheezes. Abdomen: soft, nontender, nondistended. Normal bowel sounds.  No appreciable masses  Extremities: warm, well perfused, cap refill < 2 sec.   Musculoskeletal: Normal muscle mass.  Normal strength Skin: warm, dry.  No rash or lesions. Neurologic: alert and oriented, normal speech, no tremor    Labs:  Lab Results  Component Value Date   HGBA1C 5.9 (A) 04/07/2020   Results for orders placed or performed in visit on 04/07/20  POCT glycosylated hemoglobin (Hb A1C)  Result Value Ref Range   Hemoglobin A1C 5.9 (A) 4.0 - 5.6 %   HbA1c POC (<> result, manual entry)     HbA1c, POC (prediabetic range)     HbA1c, POC (controlled diabetic range)    POCT Glucose (Device for Home Use)  Result Value Ref Range   Glucose Fasting, POC     POC Glucose 97 70 - 99 mg/dl    Assessment/Plan: [name] is a 19 y.o. female with type 1 diabetes recently started on TSlim insulin pump. Overall excellent control on closed loop insulin pump sytem. Hemoglobin A1c is 5.9% with <4% in hypoglycemic range.   1-3. DM w/o  complication type I, uncontrolled (HCC)/HyperglycemiaI/hypoglycemia/Elevated A1c .   - Reviewed insulin pump and CGM download. Discussed trends and patterns.  - Rotate pump sites to prevent scar tissue.  - bolus 15 minutes prior to eating to limit blood sugar spikes.  - Reviewed carb counting and importance of accurate carb counting.  - Discussed signs and symptoms of hypoglycemia. Always have glucose available.  - POCT glucose and hemoglobin A1c  - Reviewed growth chart.   4. Adjustment Reaction  - Discussed concerns and barriers to care  - Answered questions.   5. Hypovitaminosis D  - 2000 units of vitamin D per day   6 Insulin pump titration  12AM 0.73--> 0.70   6am 1pm 0.80 --> 0.75 0.73   5pm 0.75  10pm 0.70         Insulin Sensitivity Factor 12AM 40  6am 35--> 40   5pm 38  10pm 40        Follow-up:   3 months.    >45 spent today reviewing the medical chart, counseling the patient/family, and documenting today's visit.  When a patient is on insulin, intensive monitoring of blood glucose levels is necessary to avoid hyperglycemia and hypoglycemia. Severe hyperglycemia/hypoglycemia can lead to hospital admissions and be life threatening.     Hermenia Bers,  FNP-C  Pediatric Specialist  [address] [place]  [place], [address]  Tele: [phone]

## 2020-04-19 ENCOUNTER — Other Ambulatory Visit (INDEPENDENT_AMBULATORY_CARE_PROVIDER_SITE_OTHER): Payer: Self-pay | Admitting: Family

## 2020-04-19 DIAGNOSIS — E109 Type 1 diabetes mellitus without complications: Secondary | ICD-10-CM

## 2020-04-28 DIAGNOSIS — E109 Type 1 diabetes mellitus without complications: Secondary | ICD-10-CM | POA: Diagnosis not present

## 2020-04-28 DIAGNOSIS — Z794 Long term (current) use of insulin: Secondary | ICD-10-CM | POA: Diagnosis not present

## 2020-05-09 DIAGNOSIS — Z794 Long term (current) use of insulin: Secondary | ICD-10-CM | POA: Diagnosis not present

## 2020-05-09 DIAGNOSIS — E109 Type 1 diabetes mellitus without complications: Secondary | ICD-10-CM | POA: Diagnosis not present

## 2020-05-27 ENCOUNTER — Other Ambulatory Visit (INDEPENDENT_AMBULATORY_CARE_PROVIDER_SITE_OTHER): Payer: Self-pay | Admitting: Family

## 2020-07-07 ENCOUNTER — Encounter (INDEPENDENT_AMBULATORY_CARE_PROVIDER_SITE_OTHER): Payer: Self-pay | Admitting: Family

## 2020-07-07 ENCOUNTER — Other Ambulatory Visit: Payer: Self-pay

## 2020-07-07 ENCOUNTER — Ambulatory Visit (INDEPENDENT_AMBULATORY_CARE_PROVIDER_SITE_OTHER): Payer: BC Managed Care – PPO | Admitting: Family

## 2020-07-07 VITALS — BP 112/70 | HR 100 | Wt 114.8 lb

## 2020-07-07 DIAGNOSIS — Z4681 Encounter for fitting and adjustment of insulin pump: Secondary | ICD-10-CM | POA: Diagnosis not present

## 2020-07-07 DIAGNOSIS — E109 Type 1 diabetes mellitus without complications: Secondary | ICD-10-CM

## 2020-07-07 DIAGNOSIS — E559 Vitamin D deficiency, unspecified: Secondary | ICD-10-CM

## 2020-07-07 DIAGNOSIS — E10649 Type 1 diabetes mellitus with hypoglycemia without coma: Secondary | ICD-10-CM | POA: Diagnosis not present

## 2020-07-07 DIAGNOSIS — E1065 Type 1 diabetes mellitus with hyperglycemia: Secondary | ICD-10-CM

## 2020-07-07 DIAGNOSIS — R739 Hyperglycemia, unspecified: Secondary | ICD-10-CM

## 2020-07-07 LAB — POCT GLYCOSYLATED HEMOGLOBIN (HGB A1C): Hemoglobin A1C: 6 % — AB (ref 4.0–5.6)

## 2020-07-07 LAB — POCT GLUCOSE (DEVICE FOR HOME USE): Glucose Fasting, POC: 105 mg/dL — AB (ref 70–99)

## 2020-07-07 NOTE — Progress Notes (Signed)
Pediatric Endocrinology Diabetes Consultation Follow-up Visit  [name] Nov 02, 2000 [id number]  Chief Complaint: Follow-up type 1 diabetes   Patient, No Pcp Per   HPI: [name]  is a 20 y.o. female presenting for follow-up of type 1 diabetes. she is accompanied to this visit by her mother and father.  [name] presented to Urgent care on 03/19/15 after having vomiting. She had a 2-3 week history of polyuria and polydipsia. She was also more tired for weeks prior to her admission and had been losing weight. She was transferred to [facility] with a glucose of >600 and pH of 6.895. She was treated with the 2 bag method. She has no other family members with type 1 diabetes. Mom has thyroid disease. She was discharged on 03/23/15 on Lantus and Novolog. Her pancreatic islet cell antibodies were positive.   2. Since last visit to [facility] on 03/2020 , she has been well.  No ER visits or hospitalizations.  She has been driving a lot, wants to get her license. 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   Using Tslim insulin pump and Dexcom CGM. She feels like it is working well. Bolusing before eating and carb counting is going well. Rarely has pump site failures. No issues with dexcom.    Concerns:  - Having low blood sugars in the morning after breakfast.    Insulin regimen: 21 units of Basaglar. Humalog  120/30/8 plan.. TSLIM insulin pump 12AM 0.73  6am 1pm 0.80  0.73   5pm 0.75  10pm 0.70        Insulin to Carbohydrate Ratio 12AM 10  6am 1pm 8 9   5pm 8.5   10pm 10         Insulin Sensitivity Factor 12AM 40  6am 30  5pm 30  10pm 40       Target Blood Glucose 12AM 120                 Hypoglycemia:Able to feel low blood sugars.  No glucagon needed recently.  Blood glucose download:  Dexcom CGM Download   Med-alert ID: Not currently wearing. Injection sites: arms  and abdomen.  Annual labs due: 05/2019 Ophthalmology due: 2019--> Discussed importance of dilated eye exam today.     3. ROS: Greater than 10 systems reviewed with pertinent positives listed in HPI, otherwise neg. Constitutional: Sleeping well.4 lbs weight loss Eyes: No changes in vision. She is due for eye exam. No blurry vision.  Ears/Nose/Mouth/Throat: No difficulty swallowing. Cardiovascular: No palpitations. No chest pain  Respiratory: No increased work of breathing Gastrointestinal: No constipation or diarrhea. No abdominal pain Genitourinary: No nocturia, no polyuria Neurologic: Normal sensation, no tremor Endocrine: No polydipsia.  No hyperpigmentation Psychiatric: Normal affect. Denies anxiety and depression.   Past Medical History:   Past Medical History:  Diagnosis Date  . Diabetes mellitus without complication ([place])   . Eczema     Medications:  Outpatient Encounter Medications as of 07/07/2020  Medication Sig Note  . acetone, urine, test strip 1 strip by Does not apply route as needed for high blood sugar. 12/31/2019: PRN  . BD PEN NEEDLE NANO 2ND GEN 32G X 4 MM MISC USE TO INJECT INSULIN AS DIRECTED. 12/31/2019: PRN pump failure  . Blood Glucose Monitoring Suppl (ONETOUCH VERIO FLEX SYSTEM) w/Device KIT Use to check glucose   . diclofenac (VOLTAREN) 75 MG EC tablet Take 1  tablet (75 mg total) by mouth 2 (two) times daily.   Marland Kitchen glucagon (GLUCAGON EMERGENCY) 1 MG injection Inject 1 mg into the vein once as needed.   . Glucagon (GVOKE HYPOPEN 2-PACK) 1 MG/0.2ML SOAJ Inject 1 Dose into the skin as needed. 12/31/2019: PRN emergencies  . insulin aspart (NOVOLOG FLEXPEN) 100 UNIT/ML FlexPen Use up to 50 units daily 12/31/2019: PRN pump failure  . insulin aspart (NOVOLOG) 100 UNIT/ML injection INJECT UP TO 300 UNITS INTO PUMP EVERY 48 HOURS   . insulin glargine (LANTUS) 100 UNIT/ML injection Inject up to 50 units daily 12/31/2019: PRN pump failure  . Insulin Human (INSULIN PUMP) SOLN  Inject into the skin.   Marland Kitchen insulin lispro (HUMALOG KWIKPEN) 100 UNIT/ML KwikPen INJECT UP TO 50 UNITS PER DAY   . ONE TOUCH LANCETS MISC Use to check blood sugar up to six times a day.   Glory Rosebush VERIO test strip CHECK BLOOD SUGAR 6 TIMES DAILY.   Marland Kitchen Vitamin D, Ergocalciferol, (DRISDOL) 1.25 MG (50000 UNIT) CAPS capsule Take 1 capsule (50,000 Units total) by mouth once a week.   . [DISCONTINUED] cetirizine (ZYRTEC) 10 MG tablet Take 10 mg by mouth daily as needed for allergies. Reported on 07/22/2015 (Patient not taking: Reported on 12/31/2019)    No facility-administered encounter medications on file as of 07/07/2020.    Allergies: Allergies  Allergen Reactions  . Apple Fiber Anaphylaxis    Swelling of face and throat when eats apple peelings  . Cherry     Throat and lip swelling     Surgical History: Past Surgical History:  Procedure Laterality Date  . WISDOM TOOTH EXTRACTION      Family History:  Family History  Problem Relation Age of Onset  . Graves' disease Mother   . Hypertension Mother   . Hypertension Father       Social History: Lives with: Mother and father  Starting Cosmetology school in the Spring   Physical Exam:  Vitals:   07/07/20 1101  BP: 112/70  Pulse: 100  Weight: 114 lb 12.8 oz (52.1 kg)   BP 112/70   Pulse 100   Wt 114 lb 12.8 oz (52.1 kg)   BMI 23.39 kg/m  Body mass index: body mass index is 23.39 kg/m. Growth percentile SmartLinks can only be used for patients less than 65 years old.  Ht Readings from Last 3 Encounters:  07/30/19 4' 10.74" (1.492 m) (1 %, Z= -2.17)*  03/12/19 4' 10.54" (1.487 m) (1 %, Z= -2.25)*  11/27/18 4' 10.54" (1.487 m) (1 %, Z= -2.24)*   * Growth percentiles are based on CDC (Girls, 2-20 Years) data.   Wt Readings from Last 3 Encounters:  07/07/20 114 lb 12.8 oz (52.1 kg)  04/07/20 118 lb 6.4 oz (53.7 kg)  12/31/19 116 lb 12.8 oz (53 kg)    Physical Exam.  General: Well developed, well nourished female in no  acute distress.  Head: Normocephalic, atraumatic.   Eyes:  Pupils equal and round. EOMI.   Sclera white.  No eye drainage.   Ears/Nose/Mouth/Throat: Nares patent, no nasal drainage.  Normal dentition, mucous membranes moist.   Neck: supple, no cervical lymphadenopathy, no thyromegaly Cardiovascular: regular rate, normal S1/S2, no murmurs Respiratory: No increased work of breathing.  Lungs clear to auscultation bilaterally.  No wheezes. Abdomen: soft, nontender, nondistended. Normal bowel sounds.  No appreciable masses  Extremities: warm, well perfused, cap refill < 2 sec.   Musculoskeletal: Normal muscle mass.  Normal strength Skin:  warm, dry.  No rash or lesions. Neurologic: alert and oriented, normal speech, no tremor   Labs:  Lab Results  Component Value Date   HGBA1C 6.0 (A) 07/07/2020   Results for orders placed or performed in visit on 07/07/20  POCT glycosylated hemoglobin (Hb A1C)  Result Value Ref Range   Hemoglobin A1C 6.0 (A) 4.0 - 5.6 %   HbA1c POC (<> result, manual entry)     HbA1c, POC (prediabetic range)     HbA1c, POC (controlled diabetic range)    POCT Glucose (Device for Home Use)  Result Value Ref Range   Glucose Fasting, POC 105 (A) 70 - 99 mg/dL   POC Glucose      Assessment/Plan: [name] is a 20 y.o. female with type 1 diabetes recently started on TSlim insulin pump. Having a pattern of hypoglycemia between 8am-11am which is likely due to change in work schedule. Hemoglobin A1c is 6% today which meets the ADA goal of <7.5%.   1-3. DM w/o complication type I, uncontrolled (HCC)/HyperglycemiaI/hypoglycemia/Elevated A1c  - Reviewed insulin pump and CGM download. Discussed trends and patterns.  - Rotate pump sites to prevent scar tissue.  - bolus 15 minutes prior to eating to limit blood sugar spikes.  - Reviewed carb counting and importance of accurate carb counting.  - Discussed signs and symptoms of hypoglycemia. Always have glucose available.  - POCT  glucose and hemoglobin A1c  - Reviewed growth chart.   4. Hypovitaminosis D  - 2000 units of vitamin D per day   5 Insulin pump titration  12AM  0.70   6am 1pm  0.75--> 0.70  0.73   5pm 0.75  10pm 0.70        Insulin to Carbohydrate Ratio 12AM 10  6am 1pm 8--> 9  9   5pm 8.5   10pm 10        Insulin Sensitivity Factor 12AM 40  6am 40 --> 45   5pm 38  10pm 40        Follow-up:   3 months.    >45 spent today reviewing the medical chart, counseling the patient/family, and documenting today's visit.  When a patient is on insulin, intensive monitoring of blood glucose levels is necessary to avoid hyperglycemia and hypoglycemia. Severe hyperglycemia/hypoglycemia can lead to hospital admissions and be life threatening.     Hermenia Bers,  FNP-C  Pediatric Specialist  [address] [place]  [place], [address]  Tele: [phone]

## 2020-07-07 NOTE — Patient Instructions (Addendum)
Hypoglycemia  . Shaking or trembling. . Sweating and chills. . Dizziness or lightheadedness. . Faster heart rate. Marland Kitchen Headaches. . Hunger. . Nausea. . Nervousness or irritability. . Pale skin. Marland Kitchen Restless sleep. . Weakness. Kennis Carina vision. . Confusion or trouble concentrating. . Sleepiness. . Slurred speech. . Tingling or numbness in the face or mouth.  How do I treat an episode of hypoglycemia? The American Diabetes Association recommends the "15-15 rule" for an episode of hypoglycemia: . Eat or drink 15 grams of carbs to raise your blood sugar. . After 15 minutes, check your blood sugar. . If it's still below 70 mg/dL, have another 15 grams of carbs. . Repeat until your blood sugar is at least 70 mg/dL.  Hyperglycemia  . Frequent urination . Increased thirst . Blurred vision . Fatigue . Headache Diabetic Ketoacidosis (DKA)  If hyperglycemia goes untreated, it can cause toxic acids (ketones) to build up in your blood and urine (ketoacidosis). Signs and symptoms include: . Fruity-smelling breath . Nausea and vomiting . Shortness of breath . Dry mouth . Weakness . Confusion . Coma . Abdominal pain        Sick day/Ketones Protocol  . Check blood glucose every 2 hours  . Check urine ketones every 2 hours (until ketones are clear)  . Drink plenty of fluids (water, Pedialyte) hourly . Give rapid acting insulin correction dose every 3 hours until ketones are clear  . Notify clinic of sickness/ketones  . If you develop signs of DKA, go to ER immediately.   Hemoglobin A1c levels     12AM  0.70   6am 1pm  0.75--> 0.70  0.73   5pm 0.75  10pm 0.70        Insulin to Carbohydrate Ratio 12AM 10  6am 1pm 8--> 9  9   5pm 8.5   10pm 10          Insulin Sensitivity Factor 12AM 40  6am 40 --> 45   5pm 38  10pm 40

## 2020-08-01 DIAGNOSIS — E109 Type 1 diabetes mellitus without complications: Secondary | ICD-10-CM | POA: Diagnosis not present

## 2020-08-01 DIAGNOSIS — Z794 Long term (current) use of insulin: Secondary | ICD-10-CM | POA: Diagnosis not present

## 2020-08-07 ENCOUNTER — Telehealth (INDEPENDENT_AMBULATORY_CARE_PROVIDER_SITE_OTHER): Payer: Self-pay | Admitting: Family

## 2020-08-07 NOTE — Telephone Encounter (Signed)
PPW faxed on the 9th and 10th.

## 2020-08-07 NOTE — Telephone Encounter (Signed)
Who's calling (name and relationship to patient) : collette from [facility]  Best contact number: [phone]  Provider they see: spenser beasley  Reason for call: Detailed written order was faxed for CGM supplies. Please call to request a second fax if not received.   Call ID:      PRESCRIPTION REFILL ONLY  Name of prescription:  Pharmacy:

## 2020-08-12 DIAGNOSIS — E109 Type 1 diabetes mellitus without complications: Secondary | ICD-10-CM | POA: Diagnosis not present

## 2020-08-12 DIAGNOSIS — Z794 Long term (current) use of insulin: Secondary | ICD-10-CM | POA: Diagnosis not present

## 2020-09-09 ENCOUNTER — Encounter (INDEPENDENT_AMBULATORY_CARE_PROVIDER_SITE_OTHER): Payer: Self-pay | Admitting: Dietician

## 2020-10-06 ENCOUNTER — Ambulatory Visit (INDEPENDENT_AMBULATORY_CARE_PROVIDER_SITE_OTHER): Payer: BC Managed Care – PPO | Admitting: Family

## 2020-10-06 ENCOUNTER — Other Ambulatory Visit: Payer: Self-pay

## 2020-10-06 ENCOUNTER — Encounter (INDEPENDENT_AMBULATORY_CARE_PROVIDER_SITE_OTHER): Payer: Self-pay | Admitting: Family

## 2020-10-06 VITALS — BP 118/78 | HR 88 | Ht 58.43 in | Wt 119.0 lb

## 2020-10-06 DIAGNOSIS — E559 Vitamin D deficiency, unspecified: Secondary | ICD-10-CM | POA: Diagnosis not present

## 2020-10-06 DIAGNOSIS — E109 Type 1 diabetes mellitus without complications: Secondary | ICD-10-CM

## 2020-10-06 DIAGNOSIS — E10649 Type 1 diabetes mellitus with hypoglycemia without coma: Secondary | ICD-10-CM | POA: Diagnosis not present

## 2020-10-06 DIAGNOSIS — Z4681 Encounter for fitting and adjustment of insulin pump: Secondary | ICD-10-CM

## 2020-10-06 DIAGNOSIS — R739 Hyperglycemia, unspecified: Secondary | ICD-10-CM

## 2020-10-06 LAB — POCT GLYCOSYLATED HEMOGLOBIN (HGB A1C): Hemoglobin A1C: 6 % — AB (ref 4.0–5.6)

## 2020-10-06 LAB — POCT GLUCOSE (DEVICE FOR HOME USE): POC Glucose: 202 mg/dl — AB (ref 70–99)

## 2020-10-06 NOTE — Progress Notes (Signed)
Pediatric Endocrinology Diabetes Consultation Follow-up Visit  [name] 15-Jan-2001 [id number]  Chief Complaint: Follow-up type 1 diabetes   Patient, No Pcp Per (Inactive)   HPI: [name]  is a 20 y.o. female presenting for follow-up of type 1 diabetes. she is accompanied to this visit by her mother and father.  [name] presented to Urgent care on 03/19/15 after having vomiting. She had a 2-3 week history of polyuria and polydipsia. She was also more tired for weeks prior to her admission and had been losing weight. She was transferred to [facility] with a glucose of >600 and pH of 6.895. She was treated with the 2 bag method. She has no other family members with type 1 diabetes. Mom has thyroid disease. She was discharged on 03/23/15 on Lantus and Novolog. Her pancreatic islet cell antibodies were positive.   2. Since last visit to [facility] on 07/2020 , she has been well.  No ER visits or hospitalizations.  Has started exercising once daily for about 30 minutes. 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   Her Tslim insulin pump is working well with Dexcom CGM. She had one failed Dexcom site but otherwise, no issues. Bolsuing before eating most of the time and her carb counting is accurate. She is rotating pump sites between her stomach and back mostly. Hypoglycemia is rare, she feels shakey and tired when low.   She takes 2000 units of vitamin D3 daily.   Insulin regimen: 21 units of Basaglar. Humalog  120/30/8 plan.. TSLIM insulin pump 12AM 0.73  6am 1pm 0.75 0.73   5pm 0.75  10pm 0.70        Insulin to Carbohydrate Ratio 12AM 10  6am 1pm 9 9   5pm 8.5   10pm 10         Insulin Sensitivity Factor 12AM 40  6am 45   5pm 30  10pm 40       Target Blood Glucose 12AM 120                 Hypoglycemia:Able to feel low blood sugars.  No glucagon needed recently.  Blood glucose download:  Dexcom CGM Download   Med-alert ID: Not  currently wearing. Injection sites: arms and abdomen.  Annual labs due: 05/2019-->  Ordered  Ophthalmology due: 2019--> Discussed importance of dilated eye exam today.     3. ROS: Greater than 10 systems reviewed with pertinent positives listed in HPI, otherwise neg. Constitutional: Sleeping well. Weight stable.  Eyes: No changes in vision. She is due for eye exam. No blurry vision.  Ears/Nose/Mouth/Throat: No difficulty swallowing. Cardiovascular: No palpitations. No chest pain  Respiratory: No increased work of breathing Gastrointestinal: No constipation or diarrhea. No abdominal pain Genitourinary: No nocturia, no polyuria Neurologic: Normal sensation, no tremor Endocrine: No polydipsia.  No hyperpigmentation Psychiatric: Normal affect. Denies anxiety and depression.   Past Medical History:   Past Medical History:  Diagnosis Date  . Diabetes mellitus without complication ([place])   . Eczema     Medications:  Outpatient Encounter Medications as of 10/06/2020  Medication Sig Note  . insulin aspart (NOVOLOG) 100 UNIT/ML injection INJECT UP TO 300 UNITS INTO PUMP EVERY 48 HOURS   . Vitamin D, Ergocalciferol, (DRISDOL) 1.25 MG (50000 UNIT) CAPS capsule Take 1 capsule (50,000 Units total) by mouth once a week.   Marland Kitchen acetone, urine, test strip 1 strip by Does not apply route as needed for high blood sugar. (Patient not  taking: Reported on 10/06/2020) 12/31/2019: PRN  . BD PEN NEEDLE NANO 2ND GEN 32G X 4 MM MISC USE TO INJECT INSULIN AS DIRECTED. (Patient not taking: Reported on 10/06/2020) 12/31/2019: PRN pump failure  . Blood Glucose Monitoring Suppl ([place]) w/Device KIT Use to check glucose (Patient not taking: Reported on 10/06/2020)   . diclofenac (VOLTAREN) 75 MG EC tablet Take 1 tablet (75 mg total) by mouth 2 (two) times daily. (Patient not taking: Reported on 10/06/2020)   . glucagon (GLUCAGON EMERGENCY) 1 MG injection Inject 1 mg into the vein once as needed. (Patient not  taking: Reported on 10/06/2020)   . Glucagon (GVOKE HYPOPEN 2-PACK) 1 MG/0.2ML SOAJ Inject 1 Dose into the skin as needed. (Patient not taking: Reported on 10/06/2020) 12/31/2019: PRN emergencies  . insulin aspart (NOVOLOG FLEXPEN) 100 UNIT/ML FlexPen Use up to 50 units daily (Patient not taking: Reported on 10/06/2020) 12/31/2019: PRN pump failure  . insulin glargine (LANTUS) 100 UNIT/ML injection Inject up to 50 units daily (Patient not taking: Reported on 10/06/2020) 12/31/2019: PRN pump failure  . Insulin Human (INSULIN PUMP) SOLN Inject into the skin. (Patient not taking: Reported on 10/06/2020)   . insulin lispro (HUMALOG KWIKPEN) 100 UNIT/ML KwikPen INJECT UP TO 50 UNITS PER DAY (Patient not taking: Reported on 10/06/2020)   . ONE TOUCH LANCETS MISC Use to check blood sugar up to six times a day. (Patient not taking: Reported on 10/06/2020)   . ONETOUCH VERIO test strip CHECK BLOOD SUGAR 6 TIMES DAILY. (Patient not taking: Reported on 10/06/2020)   . [DISCONTINUED] cetirizine (ZYRTEC) 10 MG tablet Take 10 mg by mouth daily as needed for allergies. Reported on 07/22/2015 (Patient not taking: Reported on 12/31/2019)    No facility-administered encounter medications on file as of 10/06/2020.    Allergies: Allergies  Allergen Reactions  . Apple Fiber Anaphylaxis    Swelling of face and throat when eats apple peelings  . Cherry     Throat and lip swelling     Surgical History: Past Surgical History:  Procedure Laterality Date  . WISDOM TOOTH EXTRACTION      Family History:  Family History  Problem Relation Age of Onset  . Graves' disease Mother   . Hypertension Mother   . Hypertension Father       Social History: Lives with: Mother and father  Starting Cosmetology school in the Spring   Physical Exam:  Vitals:   10/06/20 1131  BP: 118/78  Pulse: 88  Weight: 119 lb (54 kg)  Height: 4' 10.43" (1.484 m)   BP 118/78   Pulse 88   Ht 4' 10.43" (1.484 m)   Wt 119 lb (54 kg)   LMP 09/19/2020  (Exact Date)   BMI 24.51 kg/m  Body mass index: body mass index is 24.51 kg/m. Growth percentile SmartLinks can only be used for patients less than 34 years old.  Ht Readings from Last 3 Encounters:  10/06/20 4' 10.43" (1.484 m)  07/30/19 4' 10.74" (1.492 m) (1 %, Z= -2.17)*  03/12/19 4' 10.54" (1.487 m) (1 %, Z= -2.25)*   * Growth percentiles are based on CDC (Girls, 2-20 Years) data.   Wt Readings from Last 3 Encounters:  10/06/20 119 lb (54 kg)  07/07/20 114 lb 12.8 oz (52.1 kg)  04/07/20 118 lb 6.4 oz (53.7 kg)    Physical Exam.  General: Well developed, well nourished female in no acute distress.  Head: Normocephalic, atraumatic.   Eyes:  Pupils  equal and round. EOMI.   Sclera white.  No eye drainage.   Ears/Nose/Mouth/Throat: Nares patent, no nasal drainage.  Normal dentition, mucous membranes moist.   Neck: supple, no cervical lymphadenopathy, no thyromegaly Cardiovascular: regular rate, normal S1/S2, no murmurs Respiratory: No increased work of breathing.  Lungs clear to auscultation bilaterally.  No wheezes. Abdomen: soft, nontender, nondistended. Normal bowel sounds.  No appreciable masses  Extremities: warm, well perfused, cap refill < 2 sec.   Musculoskeletal: Normal muscle mass.  Normal strength Skin: warm, dry.  No rash or lesions. Neurologic: alert and oriented, normal speech, no tremor   Labs:  Lab Results  Component Value Date   HGBA1C 6.0 (A) 10/06/2020   Results for orders placed or performed in visit on 10/06/20  POCT glycosylated hemoglobin (Hb A1C)  Result Value Ref Range   Hemoglobin A1C 6.0 (A) 4.0 - 5.6 %   HbA1c POC (<> result, manual entry)     HbA1c, POC (prediabetic range)     HbA1c, POC (controlled diabetic range)    POCT Glucose (Device for Home Use)  Result Value Ref Range   Glucose Fasting, POC     POC Glucose 202 (A) 70 - 99 mg/dl    Assessment/Plan: Ylonda is a 20 y.o. female with type 1 diabetes recently started on TSlim  insulin pump. She is having a pattern of hypoglycemia between 10am-1pm, needs basal rate decreased. Overall she is doing excellent with insulin pump therapy. Hemoglobin A1c is 6% today.    1-3. DM w/o complication type I, uncontrolled (HCC)/HyperglycemiaI/hypoglycemia/Elevated A1c  - Reviewed insulin pump and CGM download. Discussed trends and patterns.  - Rotate pump sites to prevent scar tissue.  - bolus 15 minutes prior to eating to limit blood sugar spikes.  - Reviewed carb counting and importance of accurate carb counting.  - Discussed signs and symptoms of hypoglycemia. Always have glucose available.  - POCT glucose and hemoglobin A1c  - Reviewed growth chart.  - lipid panel, TFT and microalbumin ordered  4. Hypovitaminosis D  - 2000 units of vitamin D per day   5 Insulin pump titration  12AM  0.70   6am 1pm 0.70--> 0.65   0.73   5pm 0.75  10pm 0.70        Follow-up:   3 months.    >35 spent today reviewing the medical chart, counseling the patient/family, and documenting today's visit.   When a patient is on insulin, intensive monitoring of blood glucose levels is necessary to avoid hyperglycemia and hypoglycemia. Severe hyperglycemia/hypoglycemia can lead to hospital admissions and be life threatening.     Hermenia Bers,  FNP-C  Pediatric Specialist  [address] [place]  [place], [address]  Tele: [phone]

## 2020-10-06 NOTE — Patient Instructions (Signed)
It was a pleasure seeing you in clinic today. Please do not hesitate to contact me if you have questions or concerns.   At [facility], we are committed to providing exceptional care. You will receive a patient satisfaction survey through text or email regarding your visit today. Your opinion is important to me. Comments are appreciated.

## 2020-10-07 LAB — T4, FREE: Free T4: 1 ng/dL (ref 0.8–1.8)

## 2020-10-07 LAB — LIPID PANEL
Cholesterol: 196 mg/dL (ref <200)
HDL: 65 mg/dL (ref >=50)
LDL Cholesterol (Calc): 115 mg/dL (calc) — ABNORMAL HIGH
Non-HDL Cholesterol (Calc): 131 mg/dL (calc) — ABNORMAL HIGH (ref <130)
Total CHOL/HDL Ratio: 3 (calc) (ref <5.0)
Triglycerides: 64 mg/dL (ref <150)

## 2020-10-07 LAB — MICROALBUMIN / CREATININE URINE RATIO
Creatinine, Urine: 61 mg/dL (ref 20–275)
Microalb Creat Ratio: 7 mcg/mg creat (ref <30)
Microalb, Ur: 0.4 mg/dL

## 2020-10-07 LAB — TSH: TSH: 1.7 mIU/L

## 2020-11-05 DIAGNOSIS — Z794 Long term (current) use of insulin: Secondary | ICD-10-CM | POA: Diagnosis not present

## 2020-11-05 DIAGNOSIS — E109 Type 1 diabetes mellitus without complications: Secondary | ICD-10-CM | POA: Diagnosis not present

## 2021-01-12 ENCOUNTER — Ambulatory Visit (INDEPENDENT_AMBULATORY_CARE_PROVIDER_SITE_OTHER): Payer: BC Managed Care – PPO | Admitting: Family

## 2021-01-12 ENCOUNTER — Encounter (INDEPENDENT_AMBULATORY_CARE_PROVIDER_SITE_OTHER): Payer: Self-pay | Admitting: Family

## 2021-01-12 ENCOUNTER — Other Ambulatory Visit: Payer: Self-pay

## 2021-01-12 VITALS — BP 116/68 | HR 102 | Wt 119.0 lb

## 2021-01-12 DIAGNOSIS — E109 Type 1 diabetes mellitus without complications: Secondary | ICD-10-CM

## 2021-01-12 DIAGNOSIS — E559 Vitamin D deficiency, unspecified: Secondary | ICD-10-CM | POA: Diagnosis not present

## 2021-01-12 DIAGNOSIS — Z9641 Presence of insulin pump (external) (internal): Secondary | ICD-10-CM

## 2021-01-12 LAB — POCT GLYCOSYLATED HEMOGLOBIN (HGB A1C): Hemoglobin A1C: 5.7 % — AB (ref 4.0–5.6)

## 2021-01-12 LAB — POCT GLUCOSE (DEVICE FOR HOME USE): POC Glucose: 139 mg/dl — AB (ref 70–99)

## 2021-01-12 NOTE — Progress Notes (Signed)
Pediatric Endocrinology Diabetes Consultation Follow-up Visit  [name] 03/09/2001 [id number]  Chief Complaint: Follow-up type 1 diabetes   Patient, No Pcp Per (Inactive)   HPI: [name]  is a 20 y.o. female presenting for follow-up of type 1 diabetes. she is accompanied to this visit by her mother and father.  [name] presented to Urgent care on 03/19/15 after having vomiting. She had a 2-3 week history of polyuria and polydipsia. She was also more tired for weeks prior to her admission and had been losing weight. She was transferred to [facility] with a glucose of >600 and pH of 6.895. She was treated with the 2 bag method. She has no other family members with type 1 diabetes. Mom has thyroid disease. She was discharged on 03/23/15 on Lantus and Novolog. Her pancreatic islet cell antibodies were positive.   2. Since last visit to [facility] on 09/2020 , she has been well.  No ER visits or hospitalizations.  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   Using Tslim insulin pump with Dexcom CGM. She feels like her blood sugars have been very well controlled. She is bolusing before eating most of the time. Her carb counting is accurate at most meals; estimates she eats about 60 grams per meal. Hypoglycemia has been rare overall. She usually feels tired and skaky when under 70.    She takes 2000 units of vitamin D3 daily.   Insulin regimen: 21 units of Basaglar. Humalog  120/30/8 plan.. TSLIM insulin pump 12AM 0.73  6am 1pm 0.75 0.65  5pm 0.75  10pm 0.70        Insulin to Carbohydrate Ratio 12AM 10  6am 1pm 9 9   5pm 8.5   10pm 10         Insulin Sensitivity Factor 12AM 40  6am 45   5pm 30  10pm 40       Target Blood Glucose 12AM 120                 Hypoglycemia:Able to feel low blood sugars.  No glucagon needed recently.  Blood glucose download:  Dexcom CGM Download   Med-alert ID: Not currently wearing. Injection sites: arms and abdomen.   Annual labs due: 09/2020  Ophthalmology due: 2019--> Advised today of importance.     3. ROS: Greater than 10 systems reviewed with pertinent positives listed in HPI, otherwise neg. Constitutional: Sleeping well. Weight stable.  Eyes: No changes in vision. She is due for eye exam. No blurry vision.  Ears/Nose/Mouth/Throat: No difficulty swallowing. Cardiovascular: No palpitations. No chest pain  Respiratory: No increased work of breathing Gastrointestinal: No constipation or diarrhea. No abdominal pain Genitourinary: No nocturia, no polyuria Neurologic: Normal sensation, no tremor Endocrine: No polydipsia.  No hyperpigmentation Psychiatric: Normal affect. Denies anxiety and depression.   Past Medical History:   Past Medical History:  Diagnosis Date   Diabetes mellitus without complication ([place])    Eczema     Medications:  Outpatient Encounter Medications as of 01/12/2021  Medication Sig Note   acetone, urine, test strip 1 strip by Does not apply route as needed for high blood sugar. 12/31/2019: PRN   BD PEN NEEDLE NANO 2ND GEN 32G X 4 MM MISC USE TO INJECT INSULIN AS DIRECTED. 12/31/2019: PRN pump failure   Blood Glucose Monitoring Suppl (ONETOUCH VERIO FLEX SYSTEM) w/Device KIT Use to check glucose    diclofenac (VOLTAREN) 75 MG EC tablet Take 1 tablet (75 mg total) by mouth 2 (  two) times daily.    glucagon (GLUCAGON EMERGENCY) 1 MG injection Inject 1 mg into the vein once as needed.    Glucagon (GVOKE HYPOPEN 2-PACK) 1 MG/0.2ML SOAJ Inject 1 Dose into the skin as needed. 12/31/2019: PRN emergencies   insulin aspart (NOVOLOG FLEXPEN) 100 UNIT/ML FlexPen Use up to 50 units daily 12/31/2019: PRN pump failure   insulin aspart (NOVOLOG) 100 UNIT/ML injection INJECT UP TO 300 UNITS INTO PUMP EVERY 48 HOURS    insulin glargine (LANTUS) 100 UNIT/ML injection Inject up to 50 units daily 12/31/2019: PRN pump failure   Insulin Human (INSULIN PUMP) SOLN Inject into the skin.    insulin lispro  (HUMALOG KWIKPEN) 100 UNIT/ML KwikPen INJECT UP TO 50 UNITS PER DAY    ONE TOUCH LANCETS MISC Use to check blood sugar up to six times a day.    ONETOUCH VERIO test strip CHECK BLOOD SUGAR 6 TIMES DAILY.    Vitamin D, Ergocalciferol, (DRISDOL) 1.25 MG (50000 UNIT) CAPS capsule Take 1 capsule (50,000 Units total) by mouth once a week.    [DISCONTINUED] cetirizine (ZYRTEC) 10 MG tablet Take 10 mg by mouth daily as needed for allergies. Reported on 07/22/2015 (Patient not taking: Reported on 12/31/2019)    No facility-administered encounter medications on file as of 01/12/2021.    Allergies: Allergies  Allergen Reactions   Apple Fiber Anaphylaxis    Swelling of face and throat when eats apple peelings   Cherry     Throat and lip swelling     Surgical History: Past Surgical History:  Procedure Laterality Date   WISDOM TOOTH EXTRACTION      Family History:  Family History  Problem Relation Age of Onset   Graves' disease Mother    Hypertension Mother    Hypertension Father       Social History: Lives with: Mother and father  Starting Cosmetology school  Physical Exam:  Vitals:   01/12/21 0939  BP: 116/68  Pulse: (!) 102  Weight: 119 lb (54 kg)    BP 116/68 (BP Location: Right Arm, Patient Position: Sitting, Cuff Size: Normal)   Pulse (!) 102   Wt 119 lb (54 kg)   BMI 24.51 kg/m  Body mass index: body mass index is 24.51 kg/m. Growth percentile SmartLinks can only be used for patients less than 27 years old.  Ht Readings from Last 3 Encounters:  10/06/20 4' 10.43" (1.484 m)  07/30/19 4' 10.74" (1.492 m) (1 %, Z= -2.17)*  03/12/19 4' 10.54" (1.487 m) (1 %, Z= -2.25)*   * Growth percentiles are based on CDC (Girls, 2-20 Years) data.   Wt Readings from Last 3 Encounters:  01/12/21 119 lb (54 kg)  10/06/20 119 lb (54 kg)  07/07/20 114 lb 12.8 oz (52.1 kg)    Physical Exam.  General: Well developed, well nourished female in no acute distress.   Head:  Normocephalic, atraumatic.   Eyes:  Pupils equal and round. EOMI.   Sclera white.  No eye drainage.   Ears/Nose/Mouth/Throat: Nares patent, no nasal drainage.  Normal dentition, mucous membranes moist.   Neck: supple, no cervical lymphadenopathy, no thyromegaly Cardiovascular: regular rate, normal S1/S2, no murmurs Respiratory: No increased work of breathing.  Lungs clear to auscultation bilaterally.  No wheezes. Abdomen: soft, nontender, nondistended. Normal bowel sounds.  No appreciable masses  Extremities: warm, well perfused, cap refill < 2 sec.   Musculoskeletal: Normal muscle mass.  Normal strength Skin: warm, dry.  No rash or lesions. Neurologic: alert  and oriented, normal speech, no tremor    Labs:  Lab Results  Component Value Date   HGBA1C 5.7 (A) 01/12/2021   Results for orders placed or performed in visit on 01/12/21  POCT glycosylated hemoglobin (Hb A1C)  Result Value Ref Range   Hemoglobin A1C 5.7 (A) 4.0 - 5.6 %   HbA1c POC (<> result, manual entry)     HbA1c, POC (prediabetic range)     HbA1c, POC (controlled diabetic range)    POCT Glucose (Device for Home Use)  Result Value Ref Range   Glucose Fasting, POC     POC Glucose 139 (A) 70 - 99 mg/dl    Assessment/Plan: [name] is a 20 y.o. female with type 1 diabetes recently started on TSlim insulin pump. She is doing excellent with diabetes care. Hemoglobin A1c is 5.7% today with >70% TIR and less then 4% low.   1-3. DM w/o complication type I, uncontrolled (HCC)/HyperglycemiaI/hypoglycemia/Elevated A1c  - Reviewed insulin pump and CGM download. Discussed trends and patterns.  - Rotate pump sites to prevent scar tissue.  - bolus 15 minutes prior to eating to limit blood sugar spikes.  - Reviewed carb counting and importance of accurate carb counting.  - Discussed signs and symptoms of hypoglycemia. Always have glucose available.  - POCT glucose and hemoglobin A1c  - Reviewed growth chart.  - Advised to get  annual eye exam. Stressed importance.   4. Hypovitaminosis D  - 2000 units of vitamin D per day   5 Insulin pump titration  - No changes today. Pump in place and settings reviewed.   Follow-up:   3 months.    >45 spent today reviewing the medical chart, counseling the patient/family, and documenting today's visit.    When a patient is on insulin, intensive monitoring of blood glucose levels is necessary to avoid hyperglycemia and hypoglycemia. Severe hyperglycemia/hypoglycemia can lead to hospital admissions and be life threatening.     Hermenia Bers,  FNP-C  P[facility] Specialist  [address] [place]  [place], [address]  Tele: [phone]

## 2021-01-12 NOTE — Patient Instructions (Signed)
-  1. Bolus for all food. Pay attention when you are at work/school  - 2. Go get a dilated eye exam. Should be annually.   It was a pleasure seeing you in clinic today. Please do not hesitate to contact me if you have questions or concerns.   Please sign up for MyChart. This is a communication tool that allows you to send an email directly to me. This can be used for questions, prescriptions and blood sugar reports. We will also release labs to you with instructions on MyChart. Please do not use MyChart if you need immediate or emergency assistance. Ask our wonderful front office staff if you need assistance.   At [facility], we are committed to providing exceptional care. You will receive a patient satisfaction survey through text or email regarding your visit today. Your opinion is important to me. Comments are appreciated.

## 2021-01-29 DIAGNOSIS — E109 Type 1 diabetes mellitus without complications: Secondary | ICD-10-CM | POA: Diagnosis not present

## 2021-01-29 DIAGNOSIS — Z794 Long term (current) use of insulin: Secondary | ICD-10-CM | POA: Diagnosis not present

## 2021-02-03 DIAGNOSIS — Z794 Long term (current) use of insulin: Secondary | ICD-10-CM | POA: Diagnosis not present

## 2021-02-03 DIAGNOSIS — E109 Type 1 diabetes mellitus without complications: Secondary | ICD-10-CM | POA: Diagnosis not present

## 2021-02-10 DIAGNOSIS — Z20828 Contact with and (suspected) exposure to other viral communicable diseases: Secondary | ICD-10-CM | POA: Diagnosis not present

## 2021-02-10 DIAGNOSIS — Z20822 Contact with and (suspected) exposure to covid-19: Secondary | ICD-10-CM | POA: Diagnosis not present

## 2021-02-13 DIAGNOSIS — E108 Type 1 diabetes mellitus with unspecified complications: Secondary | ICD-10-CM | POA: Diagnosis not present

## 2021-02-13 DIAGNOSIS — N3001 Acute cystitis with hematuria: Secondary | ICD-10-CM | POA: Diagnosis not present

## 2021-04-13 ENCOUNTER — Ambulatory Visit (INDEPENDENT_AMBULATORY_CARE_PROVIDER_SITE_OTHER): Payer: BC Managed Care – PPO | Admitting: Family

## 2021-05-04 ENCOUNTER — Ambulatory Visit (INDEPENDENT_AMBULATORY_CARE_PROVIDER_SITE_OTHER): Payer: BC Managed Care – PPO | Admitting: Family

## 2021-05-04 ENCOUNTER — Other Ambulatory Visit: Payer: Self-pay

## 2021-05-04 ENCOUNTER — Encounter (INDEPENDENT_AMBULATORY_CARE_PROVIDER_SITE_OTHER): Payer: Self-pay | Admitting: Family

## 2021-05-04 VITALS — BP 120/76 | HR 86 | Wt 119.8 lb

## 2021-05-04 DIAGNOSIS — E109 Type 1 diabetes mellitus without complications: Secondary | ICD-10-CM

## 2021-05-04 DIAGNOSIS — Z4681 Encounter for fitting and adjustment of insulin pump: Secondary | ICD-10-CM

## 2021-05-04 DIAGNOSIS — E559 Vitamin D deficiency, unspecified: Secondary | ICD-10-CM | POA: Diagnosis not present

## 2021-05-04 LAB — POCT GLUCOSE (DEVICE FOR HOME USE): POC Glucose: 144 mg/dl — AB (ref 70–99)

## 2021-05-04 LAB — POCT GLYCOSYLATED HEMOGLOBIN (HGB A1C): Hemoglobin A1C: 6.2 % — AB (ref 4.0–5.6)

## 2021-05-04 MED ORDER — GVOKE HYPOPEN 2-PACK 1 MG/0.2ML ~~LOC~~ SOAJ: 1.0000 | SUBCUTANEOUS | 1 refills | Status: DC | PRN

## 2021-05-04 NOTE — Progress Notes (Signed)
Pediatric Endocrinology Diabetes Consultation Follow-up Visit  [name] February 19, 2001 [id number]  Chief Complaint: Follow-up type 1 diabetes   Patient, No Pcp Per (Inactive)   HPI: [name]  is a 20 y.o. female presenting for follow-up of type 1 diabetes. she is accompanied to this visit by her mother and father.  [name] presented to Urgent care on 03/19/15 after having vomiting. She had a 2-3 week history of polyuria and polydipsia. She was also more tired for weeks prior to her admission and had been losing weight. She was transferred to [facility] with a glucose of >600 and pH of 6.895. She was treated with the 2 bag method. She has no other family members with type 1 diabetes. Mom has thyroid disease. She was discharged on 03/23/15 on Lantus and Novolog. Her pancreatic islet cell antibodies were positive.   2. Since last visit to [facility] on 12/2020 , she has been well.  No ER visits or hospitalizations.  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 Working at [employer] about 10-15 hours per week. Has not had much time to exercise but would like to start.   Tslim insulin pump and Dexcom CGM are working very well. She is bolusing before eating at most meals, feels like she has been eating more carb heavy meals. Rotating pump sites every 3-4 days to her stomach, back. Hypoglycemia is rare, occasionally goes low around 11pm. Denies patterns of hyperglycemia.   She takes 2000 units of vitamin D3 daily.   Insulin regimen: 17 units of Basaglar. Humalog  120/40/8 plan.. TSLIM insulin pump 12AM 0.73  6am 1pm 0.65 0.73  5pm 0.75  10pm 0.770        Insulin to Carbohydrate Ratio 12AM 10  6am 1pm 9 9   5pm 8.5   10pm 10         Insulin Sensitivity Factor 12AM 40  6am 45   5pm 30  10pm 40       Target Blood Glucose 12AM 120                 Hypoglycemia:Able to feel low blood sugars.  No glucagon needed recently.  Blood glucose download:  Dexcom CGM  Download   Med-alert ID: Not currently wearing. Injection sites: arms and abdomen.  Annual labs due: 09/2020  Ophthalmology due: 2019--> Advised today of importance.     3. ROS: Greater than 10 systems reviewed with pertinent positives listed in HPI, otherwise neg. Constitutional: Sleeping well. Weight stable.  Eyes: No changes in vision. She is due for eye exam. No blurry vision.  Ears/Nose/Mouth/Throat: No difficulty swallowing. Cardiovascular: No palpitations. No chest pain  Respiratory: No increased work of breathing Gastrointestinal: No constipation or diarrhea. No abdominal pain Genitourinary: No nocturia, no polyuria Neurologic: Normal sensation, no tremor Endocrine: No polydipsia.  No hyperpigmentation Psychiatric: Normal affect. Denies anxiety and depression.   Past Medical History:   Past Medical History:  Diagnosis Date   Diabetes mellitus without complication ([place])    Eczema     Medications:  Outpatient Encounter Medications as of 05/04/2021  Medication Sig Note   Glucagon (GVOKE HYPOPEN 2-PACK) 1 MG/0.2ML SOAJ Inject 1 Dose into the skin as needed.    insulin aspart (NOVOLOG) 100 UNIT/ML injection INJECT UP TO 300 UNITS INTO PUMP EVERY 48 HOURS    Vitamin D, Ergocalciferol, (DRISDOL) 1.25 MG (50000 UNIT) CAPS capsule Take 1 capsule (50,000 Units total) by mouth once a week.    acetone,  urine, test strip 1 strip by Does not apply route as needed for high blood sugar. (Patient not taking: Reported on 05/04/2021) 12/31/2019: PRN   BD PEN NEEDLE NANO 2ND GEN 32G X 4 MM MISC USE TO INJECT INSULIN AS DIRECTED. (Patient not taking: Reported on 05/04/2021) 12/31/2019: PRN pump failure   Blood Glucose Monitoring Suppl (ONETOUCH VERIO FLEX SYSTEM) w/Device KIT Use to check glucose (Patient not taking: Reported on 05/04/2021)    diclofenac (VOLTAREN) 75 MG EC tablet Take 1 tablet (75 mg total) by mouth 2 (two) times daily. (Patient not taking: Reported on 05/04/2021)    glucagon  (GLUCAGON EMERGENCY) 1 MG injection Inject 1 mg into the vein once as needed. (Patient not taking: Reported on 05/04/2021)    insulin aspart (NOVOLOG FLEXPEN) 100 UNIT/ML FlexPen Use up to 50 units daily (Patient not taking: Reported on 05/04/2021) 12/31/2019: PRN pump failure   insulin glargine (LANTUS) 100 UNIT/ML injection Inject up to 50 units daily (Patient not taking: Reported on 05/04/2021) 12/31/2019: PRN pump failure   Insulin Human (INSULIN PUMP) SOLN Inject into the skin. (Patient not taking: Reported on 05/04/2021)    insulin lispro (HUMALOG KWIKPEN) 100 UNIT/ML KwikPen INJECT UP TO 50 UNITS PER DAY (Patient not taking: Reported on 05/04/2021)    ONE TOUCH LANCETS MISC Use to check blood sugar up to six times a day. (Patient not taking: Reported on 05/04/2021)    ONETOUCH VERIO test strip CHECK BLOOD SUGAR 6 TIMES DAILY. (Patient not taking: Reported on 05/04/2021)    [DISCONTINUED] cetirizine (ZYRTEC) 10 MG tablet Take 10 mg by mouth daily as needed for allergies. Reported on 07/22/2015 (Patient not taking: Reported on 12/31/2019)    [DISCONTINUED] Glucagon (GVOKE HYPOPEN 2-PACK) 1 MG/0.2ML SOAJ Inject 1 Dose into the skin as needed. (Patient not taking: Reported on 05/04/2021) 12/31/2019: PRN emergencies   No facility-administered encounter medications on file as of 05/04/2021.    Allergies: Allergies  Allergen Reactions   Apple Fiber Anaphylaxis    Swelling of face and throat when eats apple peelings   Cherry     Throat and lip swelling     Surgical History: Past Surgical History:  Procedure Laterality Date   WISDOM TOOTH EXTRACTION      Family History:  Family History  Problem Relation Age of Onset   Graves' disease Mother    Hypertension Mother    Hypertension Father       Social History: Lives with: Mother and father  Starting Cosmetology school  Physical Exam:  Vitals:   05/04/21 1131  BP: 120/76  Pulse: 86  Weight: 119 lb 12.8 oz (54.3 kg)     BP 120/76 (BP  Location: Left Arm, Patient Position: Sitting, Cuff Size: Normal)   Pulse 86   Wt 119 lb 12.8 oz (54.3 kg)   BMI 24.68 kg/m  Body mass index: body mass index is 24.68 kg/m. Growth percentile SmartLinks can only be used for patients less than 45 years old.  Ht Readings from Last 3 Encounters:  10/06/20 4' 10.43" (1.484 m)  07/30/19 4' 10.74" (1.492 m) (1 %, Z= -2.17)*  03/12/19 4' 10.54" (1.487 m) (1 %, Z= -2.25)*   * Growth percentiles are based on CDC (Girls, 2-20 Years) data.   Wt Readings from Last 3 Encounters:  05/04/21 119 lb 12.8 oz (54.3 kg)  01/12/21 119 lb (54 kg)  10/06/20 119 lb (54 kg)    Physical Exam.  General: Well developed, well nourished female in no acute  distress.   Head: Normocephalic, atraumatic.   Eyes:  Pupils equal and round. EOMI.   Sclera white.  No eye drainage.   Ears/Nose/Mouth/Throat: Nares patent, no nasal drainage.  Normal dentition, mucous membranes moist.   Neck: supple, no cervical lymphadenopathy, no thyromegaly Cardiovascular: regular rate, normal S1/S2, no murmurs Respiratory: No increased work of breathing.  Lungs clear to auscultation bilaterally.  No wheezes. Abdomen: soft, nontender, nondistended. Normal bowel sounds.  No appreciable masses  Extremities: warm, well perfused, cap refill < 2 sec.   Musculoskeletal: Normal muscle mass.  Normal strength Skin: warm, dry.  No rash or lesions. Neurologic: alert and oriented, normal speech, no tremor    Labs:  Lab Results  Component Value Date   HGBA1C 6.2 (A) 05/04/2021   Results for orders placed or performed in visit on 05/04/21  POCT glycosylated hemoglobin (Hb A1C)  Result Value Ref Range   Hemoglobin A1C 6.2 (A) 4.0 - 5.6 %   HbA1c POC (<> result, manual entry)     HbA1c, POC (prediabetic range)     HbA1c, POC (controlled diabetic range)    POCT Glucose (Device for Home Use)  Result Value Ref Range   Glucose Fasting, POC     POC Glucose 144 (A) 70 - 99 mg/dl     Assessment/Plan: Aylene is a 20 y.o. female with type 1 diabetes recently started on TSlim insulin pump. She is having a pattern of hypoglycemia between 11pm-1am, needs basal reduction. Hemoglobin A1c is excellent at 6.2% with minimal hypoglycemia. TIR also meets goal at >70%.    1-3. DM w/o complication type I, uncontrolled (HCC)/HyperglycemiaI/hypoglycemia/Elevated A1c  - Reviewed insulin pump and CGM download. Discussed trends and patterns.  - Rotate pump sites to prevent scar tissue.  - bolus 15 minutes prior to eating to limit blood sugar spikes.  - Reviewed carb counting and importance of accurate carb counting.  - Discussed signs and symptoms of hypoglycemia. Always have glucose available.  - POCT glucose and hemoglobin A1c  - Reviewed growth chart.    4. Hypovitaminosis D  - 2000 units of vitamin D per day   5 Insulin pump titration  12AM 0.73  6am 1pm 0.65 0.73  5pm 0.75  10pm 0.770 --> 0.70      16.68  units per day   Follow-up:   3 months.      >45 spent today reviewing the medical chart, counseling the patient/family, and documenting today's visit.  When a patient is on insulin, intensive monitoring of blood glucose levels is necessary to avoid hyperglycemia and hypoglycemia. Severe hyperglycemia/hypoglycemia can lead to hospital admissions and be life threatening.     Hermenia Bers,  FNP-C  Pediatric Specialist  [address] [place]  [place], [address]  Tele: [phone]

## 2021-05-04 NOTE — Patient Instructions (Addendum)
It was a pleasure seeing you in clinic today. Please do not hesitate to contact me if you have questions or concerns.   Please sign up for MyChart. This is a communication tool that allows you to send an email directly to me. This can be used for questions, prescriptions and blood sugar reports. We will also release labs to you with instructions on MyChart. Please do not use MyChart if you need immediate or emergency assistance. Ask our wonderful front office staff if you need assistance.   If injections needed. 1 unit for every 8 grams of carbs and 1 unit for ever 40 points above 120.   17 units of lantus.

## 2021-05-05 DIAGNOSIS — Z794 Long term (current) use of insulin: Secondary | ICD-10-CM | POA: Diagnosis not present

## 2021-05-05 DIAGNOSIS — E109 Type 1 diabetes mellitus without complications: Secondary | ICD-10-CM | POA: Diagnosis not present

## 2021-05-18 ENCOUNTER — Other Ambulatory Visit (INDEPENDENT_AMBULATORY_CARE_PROVIDER_SITE_OTHER): Payer: Self-pay | Admitting: Family

## 2021-05-18 DIAGNOSIS — Z794 Long term (current) use of insulin: Secondary | ICD-10-CM | POA: Diagnosis not present

## 2021-05-18 DIAGNOSIS — E109 Type 1 diabetes mellitus without complications: Secondary | ICD-10-CM | POA: Diagnosis not present

## 2021-05-19 ENCOUNTER — Other Ambulatory Visit (INDEPENDENT_AMBULATORY_CARE_PROVIDER_SITE_OTHER): Payer: Self-pay

## 2021-05-19 ENCOUNTER — Other Ambulatory Visit (INDEPENDENT_AMBULATORY_CARE_PROVIDER_SITE_OTHER): Payer: Self-pay | Admitting: Family

## 2021-05-19 DIAGNOSIS — E109 Type 1 diabetes mellitus without complications: Secondary | ICD-10-CM

## 2021-05-19 MED ORDER — SEMGLEE 100 UNIT/ML ~~LOC~~ SOPN: PEN_INJECTOR | SUBCUTANEOUS | 11 refills | Status: DC

## 2021-05-19 NOTE — Telephone Encounter (Signed)
Received further information from pharmacy stating that insurance will cover Semglee. Prescription for Semglee was sent in with [facility] approval

## 2021-06-05 DIAGNOSIS — R053 Chronic cough: Secondary | ICD-10-CM | POA: Diagnosis not present

## 2021-06-05 DIAGNOSIS — J209 Acute bronchitis, unspecified: Secondary | ICD-10-CM | POA: Diagnosis not present

## 2021-06-14 ENCOUNTER — Other Ambulatory Visit (INDEPENDENT_AMBULATORY_CARE_PROVIDER_SITE_OTHER): Payer: Self-pay | Admitting: Family

## 2021-08-10 ENCOUNTER — Other Ambulatory Visit: Payer: Self-pay

## 2021-08-10 ENCOUNTER — Encounter (INDEPENDENT_AMBULATORY_CARE_PROVIDER_SITE_OTHER): Payer: Self-pay | Admitting: Family

## 2021-08-10 ENCOUNTER — Ambulatory Visit (INDEPENDENT_AMBULATORY_CARE_PROVIDER_SITE_OTHER): Payer: BC Managed Care – PPO | Admitting: Family

## 2021-08-10 VITALS — BP 108/64 | HR 74 | Wt 121.8 lb

## 2021-08-10 DIAGNOSIS — E1065 Type 1 diabetes mellitus with hyperglycemia: Secondary | ICD-10-CM

## 2021-08-10 DIAGNOSIS — E109 Type 1 diabetes mellitus without complications: Secondary | ICD-10-CM | POA: Diagnosis not present

## 2021-08-10 DIAGNOSIS — E559 Vitamin D deficiency, unspecified: Secondary | ICD-10-CM

## 2021-08-10 DIAGNOSIS — Z4681 Encounter for fitting and adjustment of insulin pump: Secondary | ICD-10-CM | POA: Diagnosis not present

## 2021-08-10 LAB — POCT GLUCOSE (DEVICE FOR HOME USE): Glucose Fasting, POC: 148 mg/dL — AB (ref 70–99)

## 2021-08-10 NOTE — Patient Instructions (Addendum)
It was a pleasure seeing you in clinic today. Please do not hesitate to contact me if you have questions or concerns.  ? ?Please sign up for MyChart. This is a communication tool that allows you to send an email directly to me. This can be used for questions, prescriptions and blood sugar reports. We will also release labs to you with instructions on MyChart. Please do not use MyChart if you need immediate or emergency assistance. Ask our wonderful front office staff if you need assistance.  ? ? ?Insulin to Carbohydrate Ratio ?12AM 10  ?6am ?1pm 9--> 8  ?9 --> 8   ?5pm 8.5 --> 7.5   ?10pm 10   ?   ? ?

## 2021-08-10 NOTE — Progress Notes (Signed)
Pediatric Endocrinology Diabetes Consultation Follow-up Visit  [name] 02/04/2001 [id number]  Chief Complaint: Follow-up type 1 diabetes   Patient, No Pcp Per (Inactive)   HPI: [name]  is a 21 y.o. female presenting for follow-up of type 1 diabetes. she is accompanied to this visit by her mother and father.  [name] presented to Urgent care on 03/19/15 after having vomiting. She had a 2-3 week history of polyuria and polydipsia. She was also more tired for weeks prior to her admission and had been losing weight. She was transferred to [facility] with a glucose of >600 and pH of 6.895. She was treated with the 2 bag method. She has no other family members with type 1 diabetes. Mom has thyroid disease. She was discharged on 03/23/15 on Lantus and Novolog. Her pancreatic islet cell antibodies were positive.   2. Since last visit to [facility] on 04/2021 , she has been well.  No ER visits or hospitalizations.  Her Dexcom CGM supplies ran out yesterday and she cannot refill it until next week. Otherwise, her Dexcom CGM and tandem insulin pump are working well. She is bolusing consistently before eating and carb counts accurately. She rotates pump sites every 3 days between stomach, hips and back. Hypoglycemia is rare. She feels like she has been having more high blood sugars recently.   - Concerns:  - Feels like she is eating out to much and would to RD>   She takes 2000 units of vitamin D3 daily.   Insulin regimen: 17 units of Basaglar. Humalog  120/40/8 plan.. TSLIM insulin pump 12AM 0.73  6am 1pm 0.65 0.73  5pm 0.75  10pm 0.70       Insulin to Carbohydrate Ratio 12AM 10  6am 1pm 9 9   5pm 8.5   10pm 10         Insulin Sensitivity Factor 12AM 40  6am 45   5pm 30  10pm 40       Target Blood Glucose 12AM 120                 Hypoglycemia:Able to feel low blood sugars.  No glucagon needed recently.  Blood glucose download:  Dexcom CGM Download   Med-alert ID: Not  currently wearing. Injection sites: arms and abdomen.  Annual labs due: 09/2020  Ophthalmology due: 2019--> Advised today of importance.     3. ROS: Greater than 10 systems reviewed with pertinent positives listed in HPI, otherwise neg. Constitutional: Sleeping well.  Eyes: No changes in vision. She is due for eye exam. No blurry vision.  Ears/Nose/Mouth/Throat: No difficulty swallowing. Cardiovascular: No palpitations. No chest pain  Respiratory: No increased work of breathing Gastrointestinal: No constipation or diarrhea. No abdominal pain Genitourinary: No nocturia, no polyuria Neurologic: Normal sensation, no tremor Endocrine: No polydipsia.  No hyperpigmentation Psychiatric: Normal affect. Denies anxiety and depression.   Past Medical History:   Past Medical History:  Diagnosis Date   Diabetes mellitus without complication ([place])    Eczema     Medications:  Outpatient Encounter Medications as of 08/10/2021  Medication Sig Note   acetone, urine, test strip 1 strip by Does not apply route as needed for high blood sugar. (Patient not taking: Reported on 05/04/2021) 12/31/2019: PRN   BD PEN NEEDLE NANO 2ND GEN 32G X 4 MM MISC USE TO INJECT INSULIN AS DIRECTED. (Patient not taking: Reported on 05/04/2021) 12/31/2019: PRN pump failure   Blood Glucose Monitoring Suppl ([place]) w/Device KIT Use to  check glucose (Patient not taking: Reported on 05/04/2021)    diclofenac (VOLTAREN) 75 MG EC tablet Take 1 tablet (75 mg total) by mouth 2 (two) times daily. (Patient not taking: Reported on 05/04/2021)    glucagon (GLUCAGON EMERGENCY) 1 MG injection Inject 1 mg into the vein once as needed. (Patient not taking: Reported on 05/04/2021)    Glucagon (GVOKE HYPOPEN 2-PACK) 1 MG/0.2ML SOAJ Inject 1 Dose into the skin as needed.    insulin aspart (NOVOLOG) 100 UNIT/ML injection INJECT UP TO 300 UNITS INTO PUMP EVERY 48 HOURS    insulin glargine (SEMGLEE, YFGN,) 100 UNIT/ML Solostar Pen  INJECT UP TO 50 UNITS SUBCUTANEOUSLY DAILY    Insulin Human (INSULIN PUMP) SOLN Inject into the skin. (Patient not taking: Reported on 05/04/2021)    insulin lispro (HUMALOG KWIKPEN) 100 UNIT/ML KwikPen INJECT UP TO 50 UNITS PER DAY (Patient not taking: Reported on 05/04/2021)    LANTUS SOLOSTAR 100 UNIT/ML Solostar Pen INJECT UP TO 50 UNITS SUBCUTANEOUSLY DAILY    NOVOLOG FLEXPEN 100 UNIT/ML FlexPen USE UP TO 50 UNITS DAILY    ONE TOUCH LANCETS MISC Use to check blood sugar up to six times a day. (Patient not taking: Reported on 05/04/2021)    ONETOUCH VERIO test strip CHECK BLOOD SUGAR 6 TIMES DAILY. (Patient not taking: Reported on 05/04/2021)    Vitamin D, Ergocalciferol, (DRISDOL) 1.25 MG (50000 UNIT) CAPS capsule TAKE 1 CAPSULE BY MOUTH ONE TIME PER WEEK    [DISCONTINUED] cetirizine (ZYRTEC) 10 MG tablet Take 10 mg by mouth daily as needed for allergies. Reported on 07/22/2015 (Patient not taking: Reported on 12/31/2019)    No facility-administered encounter medications on file as of 08/10/2021.    Allergies: Allergies  Allergen Reactions   Apple Fiber Anaphylaxis    Swelling of face and throat when eats apple peelings   Cherry     Throat and lip swelling     Surgical History: Past Surgical History:  Procedure Laterality Date   WISDOM TOOTH EXTRACTION      Family History:  Family History  Problem Relation Age of Onset   Graves' disease Mother    Hypertension Mother    Hypertension Father       Social History: Lives with: Mother and father  Starting Cosmetology school  Physical Exam:  There were no vitals filed for this visit.    There were no vitals taken for this visit. Body mass index: body mass index is unknown because there is no height or weight on file. Growth percentile SmartLinks can only be used for patients less than 92 years old.  Ht Readings from Last 3 Encounters:  10/06/20 4' 10.43" (1.484 m)  07/30/19 4' 10.74" (1.492 m) (1 %, Z= -2.17)*  03/12/19 4'  10.54" (1.487 m) (1 %, Z= -2.25)*   * Growth percentiles are based on CDC (Girls, 2-20 Years) data.   Wt Readings from Last 3 Encounters:  05/04/21 119 lb 12.8 oz (54.3 kg)  01/12/21 119 lb (54 kg)  10/06/20 119 lb (54 kg)    Physical Exam.  General: Well developed, well nourished' \female'  in no acute distress.  Head: Normocephalic, atraumatic.   Eyes:  Pupils equal and round. EOMI.   Sclera white.  No eye drainage.   Ears/Nose/Mouth/Throat: Nares patent, no nasal drainage.  Normal dentition, mucous membranes moist.   Neck: supple, no cervical lymphadenopathy, no thyromegaly Cardiovascular: regular rate, normal S1/S2, no murmurs Respiratory: No increased work of breathing.  Lungs clear to auscultation  bilaterally.  No wheezes. Abdomen: soft, nontender, nondistended. Normal bowel sounds.  No appreciable masses  Extremities: warm, well perfused, cap refill < 2 sec.   Musculoskeletal: Normal muscle mass.  Normal strength Skin: warm, dry.  No rash or lesions. Neurologic: alert and oriented, normal speech, no tremor     Labs:  Lab Results  Component Value Date   HGBA1C 6.2 (A) 05/04/2021   Results for orders placed or performed in visit on 05/04/21  POCT glycosylated hemoglobin (Hb A1C)  Result Value Ref Range   Hemoglobin A1C 6.2 (A) 4.0 - 5.6 %   HbA1c POC (<> result, manual entry)     HbA1c, POC (prediabetic range)     HbA1c, POC (controlled diabetic range)    POCT Glucose (Device for Home Use)  Result Value Ref Range   Glucose Fasting, POC     POC Glucose 144 (A) 70 - 99 mg/dl    Assessment/Plan: [name] is a 21 y.o. female with type 1 diabetes recently started on TSlim insulin pump. [name] is having post prandial hyperglycemia, needs stronger carb ratio. Her time in target range is slightly below goal of >70 but overall she is doing well with diabetes managemnet.    1-3. DM w/o complication type I, uncontrolled (HCC)/HyperglycemiaI/hypoglycemia/Elevated A1c  -  Reviewed insulin pump and CGM download. Discussed trends and patterns.  - Rotate pump sites to prevent scar tissue.  - bolus 15 minutes prior to eating to limit blood sugar spikes.  - Reviewed carb counting and importance of accurate carb counting.  - Discussed signs and symptoms of hypoglycemia. Always have glucose available.  - POCT glucose and hemoglobin A1c  - Reviewed growth chart.  - Discussed importance of healthy diet and daily activity  - Refer to [place], RD per patient for healthy eating tips.   4. Hypovitaminosis D  - 2000 units of vitamin D per day   5 Insulin pump titration  Insulin to Carbohydrate Ratio 12AM 10  6am 1pm 9--> 8  9 --> 8   5pm 8.5 --> 7.5   10pm 10       Follow-up:   3 months.     >45 spent today reviewing the medical chart, counseling the patient/family, and documenting today's visit.   When a patient is on insulin, intensive monitoring of blood glucose levels is necessary to avoid hyperglycemia and hypoglycemia. Severe hyperglycemia/hypoglycemia can lead to hospital admissions and be life threatening.     Hermenia Bers,  FNP-C  Pediatric Specialist  [address] [place]  [place], [address]  Tele: [phone]

## 2021-08-10 NOTE — Progress Notes (Incomplete)
Medical Nutrition Therapy - *** Appt start time: *** Appt end time: *** Reason for referral: Type 1 Diabetes Referring provider: Gretchen Short, NP - Endo Pertinent medical hx: Type 1 Diabetes (dx age: 21)  Assessment: Food allergies: *** Pertinent Medications: see medication list - insulin Vitamins/Supplements: *** Pertinent labs:  (3/13) POCT Glucose: 148 (high) (12/5) POCT Hgb A1c: 6.2 (high)  No anthropometrics taken on *** to prevent focus on weight for appointment. Most recent anthropometrics 3/13 were used to determine dietary needs.   (***) Anthropometrics: Ht: *** cm   Wt: *** kg   BMI: ***   IBW based on *** @ ***th%: *** kg  Estimated minimum caloric needs: *** kcal/kg/day (DRI x ***) Estimated minimum protein needs: *** g/kg/day (DRI x ***) Estimated minimum fluid needs: *** mL/kg/day ([place] Segar)  Primary concerns today: Consult for carb counting education in setting of new onset type 1 diabetes. *** accompanied pt to appt today.  Dietary Intake Hx: WIC/DME: ***  Usual eating pattern includes: *** meals and *** snacks per day.  Meal location: ***  Meal duration: ***  Feeding skills: ***  Everyone served same meals: ***  Family meals: ***  Fast-food/eating out: ***  School breakfast/lunch: ***  Food Insecurity: ***  Methods of CHO counting used (Calorie [employer], MyFitnessPal, manufacturers website, food scale): *** What do you feel is your biggest struggle with CHO counting: ***  Preferred foods: *** Avoided foods: ***  24-hr recall: Breakfast: *** Snack: *** Lunch: *** Snack: *** Dinner: *** Snack: ***  Typical Snacks: *** Typical Beverages: ***  Changes made: ***   Physical Activity: ***  GI: *** GU: ***  Estimated intake *** meeting needs given *** growth.  Pt consuming various food groups: ***  Pt consuming adequate amounts of each food group: ***   Nutrition Diagnosis: (***) Food and nutrition related knowledge deficient  related to difficulties counting carbohydrates as evidence by *** report.   Intervention: *** Discussed importance of nutrient dense carbohydrates and consistent meals and snacks. Discussed all food groups, sources of each and their importance; carbohydrates vs noncarbohydrates and benefit of pairing to aid in blood glucose stabilization.  Discussed recommendations below. All questions answered, family in agreement with plan.   Nutrition Recommendations: - *** - Continue using your resources for carb counting:  Read the nutrition labels Use measuring cups Technology - Calorie Brooke [place]are, My Fitness Pal, Fooducate, manufacturer websites Handouts provided today - Consider investing in a food scale to help with measuring out carbs. Remember the scale measures the weight of the food and you have to convert to carb grams.  - Have consistent meals and snacks daily eating a variety of foods from each food group (whole grains, vegetables, fruits, low-fat or fat-free dairy, lean proteins). - Anytime you're having a snack (that's not a low-carb snack), try pairing a carbohydrate + noncarbohydrate (protein/fat)   Cheese + crackers   Peanut butter + crackers   Peanut butter OR nuts + fruit   Cheese stick + fruit   Hummus + pretzels   [place] yogurt + granola - If having nocturnal hypoglycemia:  Bedtime snack of (15-30 g complex carbohydates + protein) without insulin, especially if afternoon or evening exercise has been strenuous.  - Sick day management:  If unable to tolerate regular foods, have carbohydrates from soft foods and/or liquids (sports drinks, Pedialyte, fruit juice, gelatin)   Keep up the good work and keep practicing! Follow-up with me as you would like.  Handouts Given: -  GG Diabetes Exchange List - Snack Ideas for Kids with Diabetes - Diabetes Foods Plate  - Carbohydrate Counting by Food Weight *** - Carbohydrates vs Noncarbohydrates - Hand Portion Sizes - GG Snack Pairing  Teach  back method used.  Monitoring/Evaluation: Continue to Monitor: - Growth trends - Lab values  Follow-up in ***.  Total time spent in counseling: *** minutes.

## 2021-08-15 ENCOUNTER — Other Ambulatory Visit (INDEPENDENT_AMBULATORY_CARE_PROVIDER_SITE_OTHER): Payer: Self-pay | Admitting: Family

## 2021-08-17 DIAGNOSIS — E109 Type 1 diabetes mellitus without complications: Secondary | ICD-10-CM | POA: Diagnosis not present

## 2021-08-17 DIAGNOSIS — Z794 Long term (current) use of insulin: Secondary | ICD-10-CM | POA: Diagnosis not present

## 2021-08-19 ENCOUNTER — Other Ambulatory Visit: Payer: Self-pay

## 2021-08-19 ENCOUNTER — Ambulatory Visit (INDEPENDENT_AMBULATORY_CARE_PROVIDER_SITE_OTHER): Payer: BC Managed Care – PPO | Admitting: Dietician

## 2021-08-19 DIAGNOSIS — R739 Hyperglycemia, unspecified: Secondary | ICD-10-CM

## 2021-08-19 DIAGNOSIS — E1065 Type 1 diabetes mellitus with hyperglycemia: Secondary | ICD-10-CM | POA: Diagnosis not present

## 2021-08-19 DIAGNOSIS — R7309 Other abnormal glucose: Secondary | ICD-10-CM

## 2021-08-19 DIAGNOSIS — E109 Type 1 diabetes mellitus without complications: Secondary | ICD-10-CM | POA: Diagnosis not present

## 2021-08-19 NOTE — Patient Instructions (Signed)
Nutrition Recommendations: ?- Goal for having 1 meal at home per day.  ?- Breakfast Ideas: (try to make it the healthy plate, but if not make it a balanced snack)  ? Bagel + cheese + banana ? Rice cakes + peanut butter + yogurt  ? Granola bar w/ protein + fruit + cheese stick  ? Cheesey eggs w/ avocado toast  ?- Have consistent meals and snacks daily eating a variety of foods from each food group (whole grains, vegetables, fruits, low-fat or fat-free dairy, lean proteins). ?- If you feel like you want to skip a meal, try to grab a balanced snack instead.  ?- Anytime you're having a snack (that's not a low-carb snack), try pairing a carbohydrate + noncarbohydrate (protein/fat)  ?

## 2021-08-25 DIAGNOSIS — L309 Dermatitis, unspecified: Secondary | ICD-10-CM | POA: Diagnosis not present

## 2021-08-25 DIAGNOSIS — E108 Type 1 diabetes mellitus with unspecified complications: Secondary | ICD-10-CM | POA: Diagnosis not present

## 2021-09-18 NOTE — Progress Notes (Incomplete)
? ?  Medical Nutrition Therapy - Progress Note ?Appt start time: *** ?Appt end time: *** ?Reason for referral: Type 1 Diabetes ?Referring provider: Gretchen Short, NP - Endo ?Pertinent medical hx: Type 1 Diabetes (dx age: 21), elevated blood pressure ? ?Assessment: ?Food allergies: cherry and apple fibers  ?Pertinent Medications: see medication list - insulin ?Vitamins/Supplements: vitamin D (unknown amount) - 1x/week ?Pertinent labs:  ?(3/13) POCT Glucose: 148 (high) ?(12/5) POCT Hgb A1c: 6.2 (high) ? ?No anthropometrics taken on *** to prevent focus on weight for appointment. Most recent anthropometrics 3/13 were used to determine dietary needs.  ? ?(3/13) Anthropometrics: ?Ht: 147 cm   ?Wt: 55.2 kg   ?BMI: 24.5   ? ?Estimated minimum caloric needs: 25-35 kcal/day (DRI) ?Estimated minimum protein needs: 0.8 g/kg/day (DRI) ?Estimated minimum fluid needs: 25-35 mL/kg/day (1 mL/kcal) ? ?Primary concerns today: Follow-up for nutrition education. Pt was unaccompanied to appt today given >67 years old. ? ?Dietary Intake Hx: *** ?Usual eating pattern includes: 2-3 meals and 0 snacks per day. Occasionally skipping breakfast or dinner because she is very busy and doesn't have time.  ?Meal location: bedroom  ?Family meals: none ?Fast-food/eating out: 100% of meals (sonic, [employer] - breakfast - sausage, egg and cheese mcmuffin with water; lunch -  6 piece chicken nugget w/ small fry + water; dinner - crispy chicken sandwich w/ small fry + water)  ?Food Insecurity: none  ?Methods of CHO counting used: calorie king, google  ?What do you feel is your biggest struggle with CHO counting: no concern ? ?Preferred foods: most all veggies, most all fruits, most grains, most dairy (cheese, milk, yogurt), most proteins ?Avoided foods: cornbread, sweets, sweet breads or cakes/cupcakes, beans ? ?24-hr recall: ?Breakfast: ?Snack:  ?Lunch: ?Snack: ?Dinner: ?Snack:  ? ?Typical Snacks: pickle chips, granola bars, nutrigrain bites  *** ?Typical Beverages: water, cream soda/diet Dr. Reino Kent (occasionally - 1x/month), 2% milk *** ? ?Notes: [place] notes that she is interested in learning out to eat healthier and decrease the amount she's eating out. She mentions that she has been eating out for most of her meals for the past few years. *** ? ?Changes made: *** ?Trying to find recipes to cook to try to decrease amount of eating out.  ? 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 ?GI: no concern  ? ?Estimated intake likely meeting needs given weight maintenance. *** ?Pt consuming various food groups.  ?Pt likely consuming inadequate amounts of fruits, vegetables and dairy. *** ? ?Nutrition Diagnosis: ?(3/22) Undesirable food choices related to perception that lack of time prevents selection of food choices consistent with recommendations as evidenced by pt report of frequently skipping meals and eating out for 100% of meals. *** ? ?Intervention: ?Discussed recommendations below. All questions answered, pt in agreement with plan.  ? ?Nutrition Recommendations: ?- *** ? ?Handouts Given:  ?- ***  ? ?Handouts Given at Previous Appointments: ?- Diabetes Foods Plate  ?- Carbohydrates vs Noncarbohydrates ?- Hand Portion Sizes ?- GG Snack Pairing ? ?Teach back method used. ? ?Monitoring/Evaluation: ?Continue to Monitor: ?- Growth trends ?- Lab values ? ?Follow-up in ***. ? ?Total time spent in counseling: *** minutes. ? ?

## 2021-10-02 ENCOUNTER — Encounter (INDEPENDENT_AMBULATORY_CARE_PROVIDER_SITE_OTHER): Payer: Self-pay

## 2021-10-02 ENCOUNTER — Ambulatory Visit (INDEPENDENT_AMBULATORY_CARE_PROVIDER_SITE_OTHER): Payer: BC Managed Care – PPO | Admitting: Dietician

## 2021-11-02 ENCOUNTER — Other Ambulatory Visit (INDEPENDENT_AMBULATORY_CARE_PROVIDER_SITE_OTHER): Payer: Self-pay | Admitting: Family

## 2021-11-02 DIAGNOSIS — E109 Type 1 diabetes mellitus without complications: Secondary | ICD-10-CM

## 2021-11-18 DIAGNOSIS — Z794 Long term (current) use of insulin: Secondary | ICD-10-CM | POA: Diagnosis not present

## 2021-11-18 DIAGNOSIS — E109 Type 1 diabetes mellitus without complications: Secondary | ICD-10-CM | POA: Diagnosis not present

## 2021-11-19 DIAGNOSIS — Z794 Long term (current) use of insulin: Secondary | ICD-10-CM | POA: Diagnosis not present

## 2021-11-19 DIAGNOSIS — E109 Type 1 diabetes mellitus without complications: Secondary | ICD-10-CM | POA: Diagnosis not present

## 2021-11-23 ENCOUNTER — Encounter (INDEPENDENT_AMBULATORY_CARE_PROVIDER_SITE_OTHER): Payer: Self-pay | Admitting: Family

## 2021-11-23 ENCOUNTER — Ambulatory Visit (INDEPENDENT_AMBULATORY_CARE_PROVIDER_SITE_OTHER): Payer: BC Managed Care – PPO | Admitting: Family

## 2021-11-23 VITALS — BP 116/70 | HR 84 | Wt 129.2 lb

## 2021-11-23 DIAGNOSIS — Z9641 Presence of insulin pump (external) (internal): Secondary | ICD-10-CM | POA: Diagnosis not present

## 2021-11-23 DIAGNOSIS — E1065 Type 1 diabetes mellitus with hyperglycemia: Secondary | ICD-10-CM

## 2021-11-23 DIAGNOSIS — E559 Vitamin D deficiency, unspecified: Secondary | ICD-10-CM | POA: Diagnosis not present

## 2021-11-23 DIAGNOSIS — Z794 Long term (current) use of insulin: Secondary | ICD-10-CM | POA: Diagnosis not present

## 2021-11-23 DIAGNOSIS — E109 Type 1 diabetes mellitus without complications: Secondary | ICD-10-CM | POA: Diagnosis not present

## 2021-11-23 LAB — POCT GLUCOSE (DEVICE FOR HOME USE): POC Glucose: 72 mg/dl (ref 70–99)

## 2021-11-23 NOTE — Progress Notes (Signed)
Pediatric Endocrinology Diabetes Consultation Follow-up Visit  [name] 2000-08-19 [id number]  Chief Complaint: Follow-up type 1 diabetes   Patient, No Pcp Per   HPI: [name]  is a 21 y.o. female presenting for follow-up of type 1 diabetes. she is accompanied to this visit by her mother and father.  1. [name] presented to Urgent care on 03/19/15 after having vomiting. She had a 2-3 week history of polyuria and polydipsia. She was also more tired for weeks prior to her admission and had been losing weight. She was transferred to [facility] with a glucose of >600 and pH of 6.895. She was treated with the 2 bag method. She has no other family members with type 1 diabetes. Mom has thyroid disease. She was discharged on 03/23/15 on Lantus and Novolog. Her pancreatic islet cell antibodies were positive.   2. Since last visit to [facility] on 04/2021 , she has been well.  No ER visits or hospitalizations.  She is focusing on school now and has stopped working. She will finish school in August and then plans to restart work.   Overall she is doing well with diabetes. Using Tslim insulin pump and Dexcom CGM. Rarely has failed pump sites or sensors. She boluses before eating, estimates 40-60 grams of carbs per meal. Hypoglycemia is rare, none severe. She has not noticed patterns of high blood sugars.    She takes 2000 units of vitamin D3 daily.   Insulin regimen: 17 units of Basaglar. Humalog  120/40/8 plan.. TSLIM insulin pump 12AM 0.73  6am 1pm 0.65 0.73  5pm 0.75  10pm 0.70       Insulin to Carbohydrate Ratio 12AM 10  6am 1pm 9 9   5pm 8.5   10pm 10         Insulin Sensitivity Factor 12AM 40  6am 45   5pm 30  10pm 40       Target Blood Glucose 12AM 120                 Hypoglycemia:Able to feel low blood sugars.  No glucagon needed recently.  Blood glucose download:  Dexcom CGM Download   Med-alert ID: Not currently wearing. Injection sites: arms and abdomen.   Annual labs due: 09/2020  Ophthalmology due: 2019--> Advised today of importance.     3. ROS: Greater than 10 systems reviewed with pertinent positives listed in HPI, otherwise neg. Constitutional: Sleeping well.  Eyes: No changes in vision. She is due for eye exam. No blurry vision.  Ears/Nose/Mouth/Throat: No difficulty swallowing. Cardiovascular: No palpitations. No chest pain  Respiratory: No increased work of breathing Gastrointestinal: No constipation or diarrhea. No abdominal pain Genitourinary: No nocturia, no polyuria Neurologic: Normal sensation, no tremor Endocrine: No polydipsia.  No hyperpigmentation Psychiatric: Normal affect. Denies anxiety and depression.   Past Medical History:   Past Medical History:  Diagnosis Date   Diabetes mellitus without complication (HCC)    Eczema     Medications:  Outpatient Encounter Medications as of 11/23/2021  Medication Sig Note   insulin glargine (SEMGLEE, YFGN,) 100 UNIT/ML Solostar Pen INJECT UP TO 50 UNITS SUBCUTANEOUSLY DAILY    NOVOLOG 100 UNIT/ML injection INJECT UP TO 300 UNITS INTO PUMP EVERY 48 HOURS    NOVOLOG FLEXPEN 100 UNIT/ML FlexPen USE UP TO 50 UNITS DAILY    acetone, urine, test strip 1 strip by Does not apply route as needed for high blood sugar. (Patient not taking: Reported on 05/04/2021) 12/31/2019: PRN   BD PEN NEEDLE  NANO 2ND GEN 32G X 4 MM MISC USE TO INJECT INSULIN AS DIRECTED. (Patient not taking: Reported on 05/04/2021) 12/31/2019: PRN pump failure   Blood Glucose Monitoring Suppl (ONETOUCH VERIO FLEX SYSTEM) w/Device KIT Use to check glucose (Patient not taking: Reported on 05/04/2021)    diclofenac (VOLTAREN) 75 MG EC tablet Take 1 tablet (75 mg total) by mouth 2 (two) times daily. (Patient not taking: Reported on 05/04/2021)    glucagon (GLUCAGON EMERGENCY) 1 MG injection Inject 1 mg into the vein once as needed. (Patient not taking: Reported on 05/04/2021)    Glucagon (GVOKE HYPOPEN 2-PACK) 1 MG/0.2ML SOAJ  Inject 1 Dose into the skin as needed. (Patient not taking: Reported on 08/10/2021)    Insulin Human (INSULIN PUMP) SOLN Inject into the skin. (Patient not taking: Reported on 05/04/2021)    insulin lispro (HUMALOG KWIKPEN) 100 UNIT/ML KwikPen INJECT UP TO 50 UNITS PER DAY (Patient not taking: Reported on 05/04/2021)    LANTUS SOLOSTAR 100 UNIT/ML Solostar Pen INJECT UP TO 50 UNITS SUBCUTANEOUSLY DAILY (Patient not taking: Reported on 08/10/2021)    ONE TOUCH LANCETS MISC Use to check blood sugar up to six times a day. (Patient not taking: Reported on 05/04/2021)    ONETOUCH VERIO test strip CHECK BLOOD SUGAR 6 TIMES DAILY. (Patient not taking: Reported on 11/23/2021)    Vitamin D, Ergocalciferol, (DRISDOL) 1.25 MG (50000 UNIT) CAPS capsule TAKE 1 CAPSULE BY MOUTH ONE TIME PER WEEK (Patient not taking: Reported on 08/10/2021)    [DISCONTINUED] cetirizine (ZYRTEC) 10 MG tablet Take 10 mg by mouth daily as needed for allergies. Reported on 07/22/2015 (Patient not taking: Reported on 12/31/2019)    No facility-administered encounter medications on file as of 11/23/2021.    Allergies: Allergies  Allergen Reactions   Apple Fiber Anaphylaxis    Swelling of face and throat when eats apple peelings   Cherry     Throat and lip swelling     Surgical History: Past Surgical History:  Procedure Laterality Date   WISDOM TOOTH EXTRACTION      Family History:  Family History  Problem Relation Age of Onset   Graves' disease Mother    Hypertension Mother    Hypertension Father       Social History: Lives with: Mother and father  Starting Cosmetology school  Physical Exam:  Vitals:   11/23/21 1107  BP: 116/70  Pulse: 84  Weight: 129 lb 3.2 oz (58.6 kg)      BP 116/70   Pulse 84   Wt 129 lb 3.2 oz (58.6 kg)   BMI 26.61 kg/m  Body mass index: body mass index is 26.61 kg/m. Growth %ile SmartLinks can only be used for patients less than 53 years old.  Ht Readings from Last 3 Encounters:   10/06/20 4' 10.43" (1.484 m)  07/30/19 4' 10.74" (1.492 m) (1 %, Z= -2.17)*  03/12/19 4' 10.54" (1.487 m) (1 %, Z= -2.25)*   * Growth percentiles are based on CDC (Girls, 2-20 Years) data.   Wt Readings from Last 3 Encounters:  11/23/21 129 lb 3.2 oz (58.6 kg)  08/10/21 121 lb 12.8 oz (55.2 kg)  05/04/21 119 lb 12.8 oz (54.3 kg)    Physical Exam.  General: Well developed, well nourished female in no acute distress.   Head: Normocephalic, atraumatic.   Eyes:  Pupils equal and round. EOMI.   Sclera white.  No eye drainage.   Ears/Nose/Mouth/Throat: Nares patent, no nasal drainage.  Normal dentition, mucous membranes  moist.   Neck: supple, no cervical lymphadenopathy, no thyromegaly Cardiovascular: regular rate, normal S1/S2, no murmurs Respiratory: No increased work of breathing.  Lungs clear to auscultation bilaterally.  No wheezes. Abdomen: soft, nontender, nondistended. No appreciable masses  Extremities: warm, well perfused, cap refill < 2 sec.   Musculoskeletal: Normal muscle mass.  Normal strength Skin: warm, dry.  No rash or lesions. Neurologic: alert and oriented, normal speech, no tremor    Labs:  Lab Results  Component Value Date   HGBA1C 6.2 (A) 05/04/2021   Results for orders placed or performed in visit on 11/23/21  POCT Glucose (Device for Home Use)  Result Value Ref Range   Glucose Fasting, POC     POC Glucose 72 70 - 99 mg/dl    Assessment/Plan: [name] is a 21 y.o. female with type 1 diabetes recently started on TSlim insulin pump. Blood sugars are well controlled with 70% of time in range and minimal hypoglycemia. She is due for annual labs and hemoglobin A1c today.    1-3. DM w/o complication type I, uncontrolled (HCC)/HyperglycemiaI/hypoglycemia/Elevated A1c  - Reviewed insulin pump and CGM download. Discussed trends and patterns.  - Rotate pump sites to prevent scar tissue.  - bolus 15 minutes prior to eating to limit blood sugar spikes.  -  Reviewed carb counting and importance of accurate carb counting.  - Discussed signs and symptoms of hypoglycemia. Always have glucose available.  - POCT glucose  - Reviewed growth chart.  - Lipid panel, TFT, microalbumin and hemoglobin A1c ordered.   4. Hypovitaminosis D  - 2000 units of vitamin D per day   5 Insulin pump titration  No change, pump in place.   Follow-up:   3 months.     LOS: >30  spent today reviewing the medical chart, counseling the patient/family, and documenting today's visit.    When a patient is on insulin, intensive monitoring of blood glucose levels is necessary to avoid hyperglycemia and hypoglycemia. Severe hyperglycemia/hypoglycemia can lead to hospital admissions and be life threatening.     Gretchen Short,  FNP-C  Pediatric Specialist  [address]  [place], [address]  Tele: [phone]

## 2021-11-24 LAB — LIPID PANEL
Cholesterol: 202 mg/dL — ABNORMAL HIGH (ref <200)
HDL: 66 mg/dL (ref >=50)
LDL Cholesterol (Calc): 115 mg/dL (calc) — ABNORMAL HIGH
Non-HDL Cholesterol (Calc): 136 mg/dL (calc) — ABNORMAL HIGH (ref <130)
Total CHOL/HDL Ratio: 3.1 (calc) (ref <5.0)
Triglycerides: 104 mg/dL (ref <150)

## 2021-11-24 LAB — MICROALBUMIN / CREATININE URINE RATIO
Creatinine, Urine: 152 mg/dL (ref 20–275)
Microalb Creat Ratio: 2 mcg/mg creat (ref <30)
Microalb, Ur: 0.3 mg/dL

## 2021-11-24 LAB — HEMOGLOBIN A1C
Hgb A1c MFr Bld: 6.6 % of total Hgb — ABNORMAL HIGH (ref <5.7)
Mean Plasma Glucose: 143 mg/dL
eAG (mmol/L): 7.9 mmol/L

## 2021-11-24 LAB — T4, FREE: Free T4: 0.9 ng/dL (ref 0.8–1.8)

## 2021-11-24 LAB — TSH: TSH: 2.59 mIU/L

## 2021-12-14 ENCOUNTER — Other Ambulatory Visit: Payer: Self-pay

## 2021-12-14 ENCOUNTER — Emergency Department (HOSPITAL_COMMUNITY): Payer: BC Managed Care – PPO

## 2021-12-14 ENCOUNTER — Emergency Department (HOSPITAL_COMMUNITY)
Admission: EM | Admit: 2021-12-14 | Discharge: 2021-12-15 | Disposition: A | Discharge status: Home / Self Care | Payer: BC Managed Care – PPO | Attending: Emergency Medicine | Admitting: Emergency Medicine

## 2021-12-14 DIAGNOSIS — M79641 Pain in right hand: Secondary | ICD-10-CM | POA: Diagnosis not present

## 2021-12-14 DIAGNOSIS — R55 Syncope and collapse: Secondary | ICD-10-CM | POA: Insufficient documentation

## 2021-12-14 DIAGNOSIS — R42 Dizziness and giddiness: Secondary | ICD-10-CM | POA: Insufficient documentation

## 2021-12-14 DIAGNOSIS — Z5321 Procedure and treatment not carried out due to patient leaving prior to being seen by health care provider: Secondary | ICD-10-CM | POA: Insufficient documentation

## 2021-12-14 DIAGNOSIS — R110 Nausea: Secondary | ICD-10-CM | POA: Diagnosis not present

## 2021-12-14 LAB — BASIC METABOLIC PANEL
Anion gap: 13 (ref 5–15)
BUN: 9 mg/dL (ref 6–20)
CO2: 16 mmol/L — ABNORMAL LOW (ref 22–32)
Calcium: 9.2 mg/dL (ref 8.9–10.3)
Chloride: 107 mmol/L (ref 98–111)
Creatinine, Ser: 0.79 mg/dL (ref 0.44–1.00)
GFR, Estimated: >60 mL/min (ref >60)
Glucose, Bld: 156 mg/dL — ABNORMAL HIGH (ref 70–99)
Potassium: 4.7 mmol/L (ref 3.5–5.1)
Sodium: 136 mmol/L (ref 135–145)

## 2021-12-14 LAB — URINALYSIS, ROUTINE W REFLEX MICROSCOPIC
Bilirubin Urine: NEGATIVE
Glucose, UA: NEGATIVE mg/dL
Hgb urine dipstick: NEGATIVE
Ketones, ur: NEGATIVE mg/dL
Nitrite: NEGATIVE
Protein, ur: NEGATIVE mg/dL
Specific Gravity, Urine: 1.006 (ref 1.005–1.030)
pH: 6 (ref 5.0–8.0)

## 2021-12-14 LAB — CBC
HCT: 41.2 % (ref 36.0–46.0)
Hemoglobin: 13.4 g/dL (ref 12.0–15.0)
MCH: 31.5 pg (ref 26.0–34.0)
MCHC: 32.5 g/dL (ref 30.0–36.0)
MCV: 96.9 fL (ref 80.0–100.0)
Platelets: 239 10*3/uL (ref 150–400)
RBC: 4.25 MIL/uL (ref 3.87–5.11)
RDW: 11.9 % (ref 11.5–15.5)
WBC: 12.4 10*3/uL — ABNORMAL HIGH (ref 4.0–10.5)
nRBC: 0 % (ref 0.0–0.2)

## 2021-12-14 LAB — TROPONIN I (HIGH SENSITIVITY): Troponin I (High Sensitivity): <2 ng/L (ref <18)

## 2021-12-14 LAB — I-STAT BETA HCG BLOOD, ED (MC, WL, AP ONLY): I-stat hCG, quantitative: <5 m[IU]/mL (ref <5)

## 2021-12-14 NOTE — ED Notes (Signed)
Patient states the wait is too long and she is leaving 

## 2021-12-14 NOTE — ED Triage Notes (Addendum)
Pt had sudden onset of abdominal pain and went to restroom to vomit but "passed out' hitting right forehead on the toilet.  Headache at this time. No abdominal pain or n/v at this time.  Pt is a type 1 diabetic.  Has an insulin pump.  Sugars have been above 140 since her syncopal episode.  LMP 12/03/21

## 2021-12-14 NOTE — ED Provider Triage Note (Addendum)
Emergency Medicine Provider Triage Evaluation Note  [name] , a 21 y.o. female  was evaluated in triage.  Pt complains of syncopal episode today. The patient reports she was walking to the bathroom when she became lightheaded and nauseous. She reports that she heard ringing in her ears and had a headache. On the way to the toilet, she passed out but woke up before she hit her head. Denies any chest pain or SOB. Reports lower belly pain when she felt nausea but none now.   Review of Systems  Positive:  Negative:  Physical Exam  BP 124/80 (BP Location: Left Arm)   Pulse 76   Temp 98.5 F (36.9 C) (Oral)   Resp 16   SpO2 98%  Gen:   Awake, no distress   Resp:  Normal effort  MSK:   Moves extremities without difficulty  Other:  Cranial nerves II-XII intact. No pronator drift. Sensation intact. Strength equal in upper and lower bilateral extremities. Glucose monitor noted in lower abdomen. Abdomen soft and non tender.   Medical Decision Making  Medically screening exam initiated at 7:35 PM.  Appropriate orders placed.  [name] was informed that the remainder of the evaluation will be completed by another provider, this initial triage assessment does not replace that evaluation, and the importance of remaining in the ED until their evaluation is complete.  Will order head and neck CT given fall as well as basic labs. Hand XR   Vanessa Rich, PA-C 12/14/21 1941    Vanessa Rich, PA-C 12/14/21 1942

## 2021-12-15 ENCOUNTER — Encounter (INDEPENDENT_AMBULATORY_CARE_PROVIDER_SITE_OTHER): Payer: Self-pay

## 2022-01-01 IMAGING — DX DG FOOT COMPLETE 3+V*L*
3 series · 3 of 3 positions shown · non-contrast
Comparison: None.

CLINICAL DATA: Foot pain for 2 months, no known injury, initial
encounter

EXAM:
LEFT FOOT - COMPLETE 3+ VIEW

[foot ap]
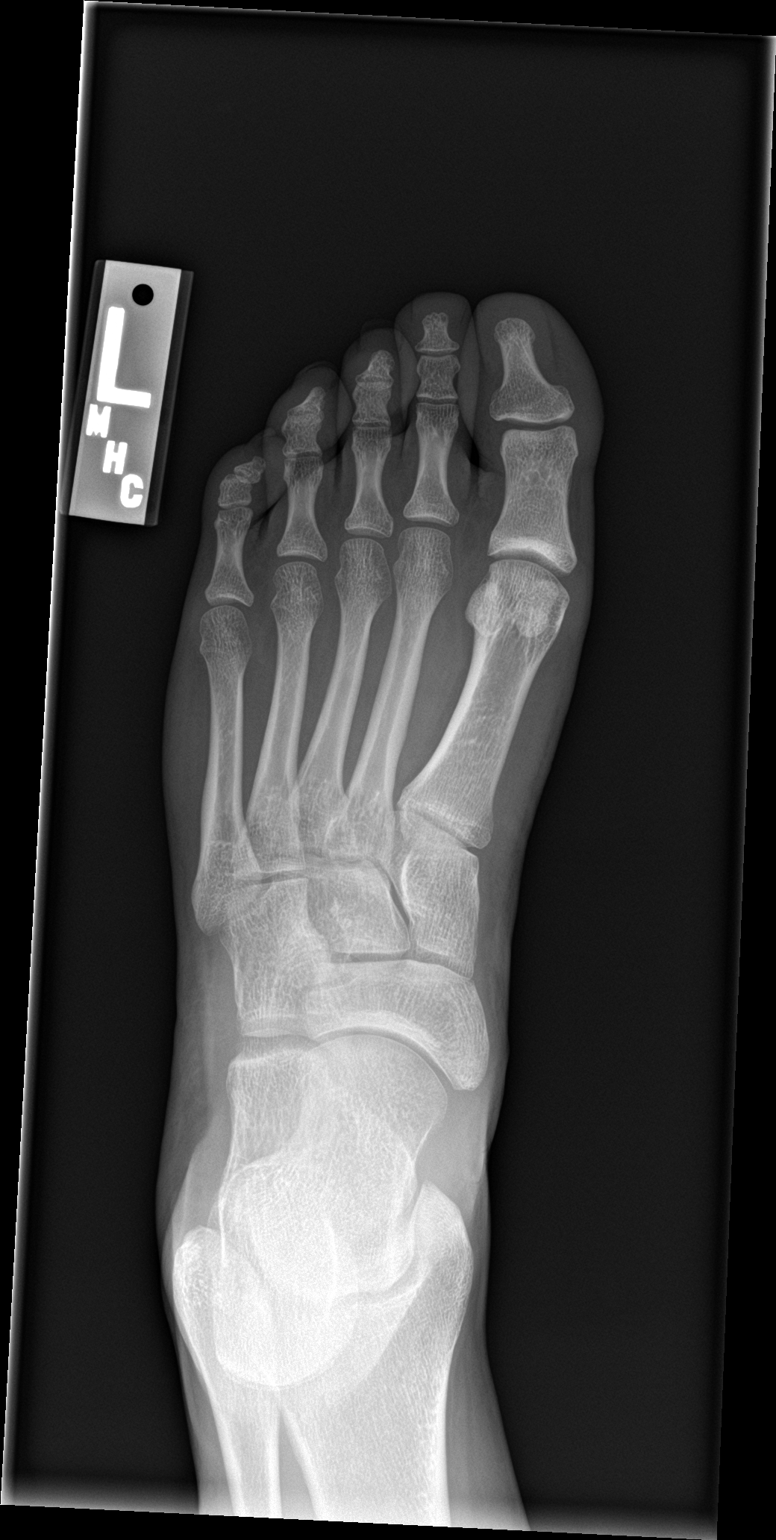

[foot obl]
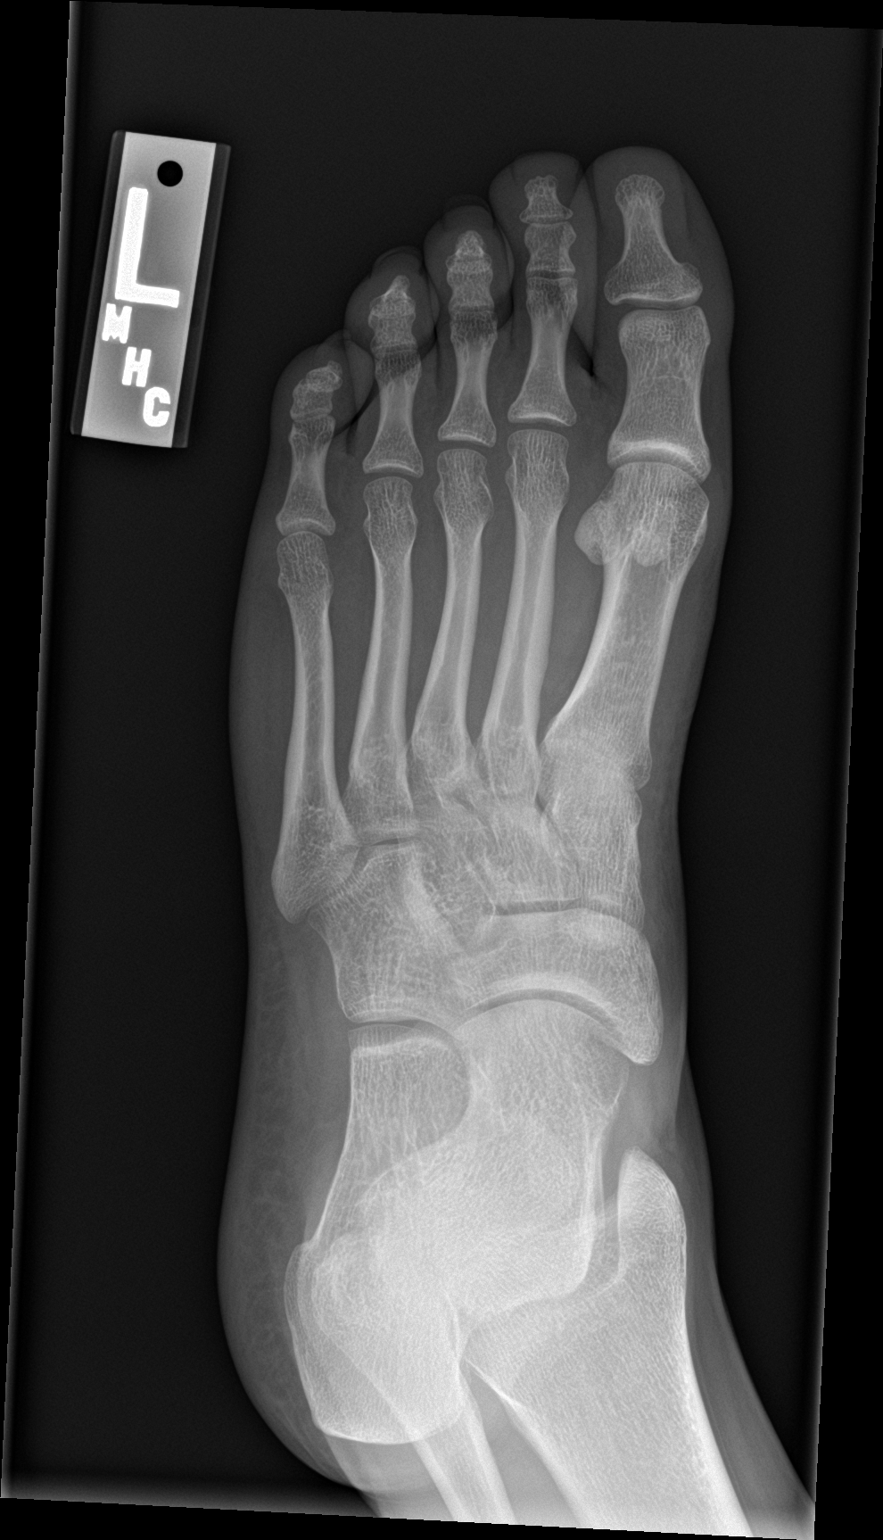

[foot lat]
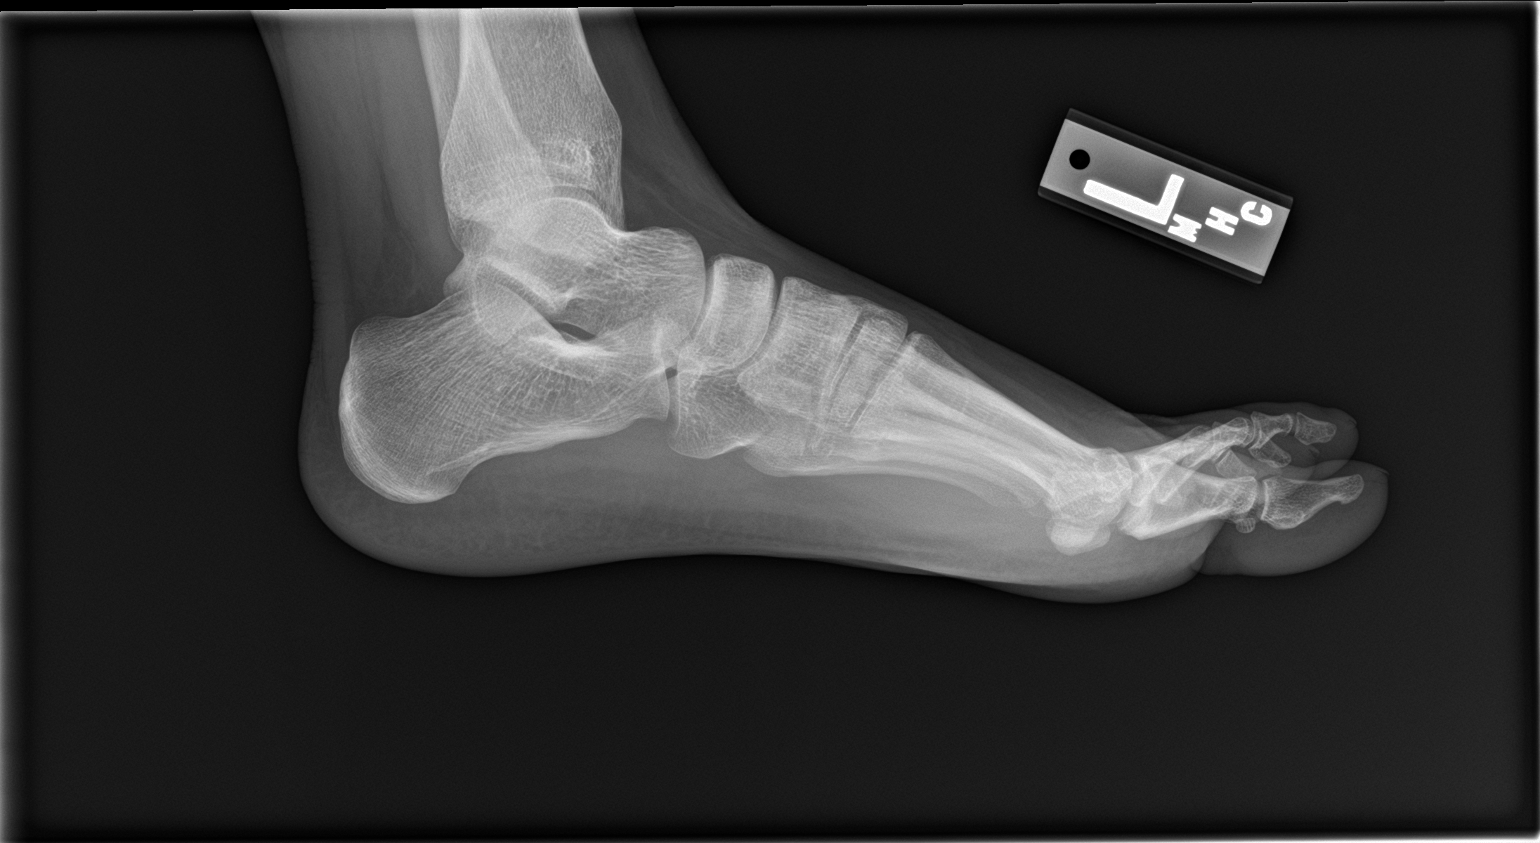

[3 of 3 positions shown; findings below may reference images not displayed]

FINDINGS: There is no evidence of fracture or dislocation. There is no
evidence of arthropathy or other focal bone abnormality. Soft
tissues are unremarkable.
IMPRESSION: No acute abnormality noted.

## 2022-02-16 DIAGNOSIS — E109 Type 1 diabetes mellitus without complications: Secondary | ICD-10-CM | POA: Diagnosis not present

## 2022-02-16 DIAGNOSIS — Z794 Long term (current) use of insulin: Secondary | ICD-10-CM | POA: Diagnosis not present

## 2022-02-22 ENCOUNTER — Encounter (INDEPENDENT_AMBULATORY_CARE_PROVIDER_SITE_OTHER): Payer: Self-pay | Admitting: Family

## 2022-02-22 ENCOUNTER — Ambulatory Visit (INDEPENDENT_AMBULATORY_CARE_PROVIDER_SITE_OTHER): Payer: BC Managed Care – PPO | Admitting: Family

## 2022-02-22 ENCOUNTER — Encounter (INDEPENDENT_AMBULATORY_CARE_PROVIDER_SITE_OTHER): Payer: Self-pay

## 2022-02-22 VITALS — BP 108/62 | HR 91 | Wt 129.4 lb

## 2022-02-22 DIAGNOSIS — R55 Syncope and collapse: Secondary | ICD-10-CM

## 2022-02-22 DIAGNOSIS — Z9641 Presence of insulin pump (external) (internal): Secondary | ICD-10-CM

## 2022-02-22 DIAGNOSIS — E559 Vitamin D deficiency, unspecified: Secondary | ICD-10-CM | POA: Diagnosis not present

## 2022-02-22 DIAGNOSIS — E1065 Type 1 diabetes mellitus with hyperglycemia: Secondary | ICD-10-CM | POA: Diagnosis not present

## 2022-02-22 LAB — POCT GLYCOSYLATED HEMOGLOBIN (HGB A1C): Hemoglobin A1C: 6.9 % — AB (ref 4.0–5.6)

## 2022-02-22 LAB — POCT GLUCOSE (DEVICE FOR HOME USE): POC Glucose: 158 mg/dl — AB (ref 70–99)

## 2022-02-22 NOTE — Progress Notes (Signed)
Pediatric Endocrinology Diabetes Consultation Follow-up Visit  [name] [birth year] [id number]  Chief Complaint: Follow-up type 1 diabetes   Patient, No Pcp Per   HPI: [name]  is a 21 y.o. female presenting for follow-up of type 1 diabetes. she is accompanied to this visit by her mother and father.  [place] presented to Urgent care on 03/19/15 after having vomiting. She had a 2-3 week history of polyuria and polydipsia. She was also more tired for weeks prior to her admission and had been losing weight. She was transferred to [facility] with a glucose of >600 and pH of 6.895. She was treated with the 2 bag method. She has no other family members with type 1 diabetes. Mom has thyroid disease. She was discharged on 03/23/15 on Lantus and Novolog. Her pancreatic islet cell antibodies were positive.   2. Since last visit to [facility] on 10/2021 , she has been well.  No ER visits or hospitalizations.  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 In -- she was getting up to use the bathroom, her stomach was hurting and she had a headache. She fell down briefly. After getting up and using the bathroom she went to the ER for evaluation, a head CT was ordered but she left due to the ait being to long. No further episodes since that time.    Reports blood sugars have been running higher because she is "stress eating". Has been eating cereal in the morning. She boluses before eating. Hypoglycemia has been rare, none severe.   Taking 2000 units of Vitamin D3 daily   Insulin regimen: 17 units of Basaglar. Humalog  120/40/8 plan.. TSLIM insulin pump 12AM 0.73  6am 1pm 0.65 0.73  5pm 0.75  10pm 0.70     17 units per day   Insulin to Carbohydrate Ratio 12AM 10  6am 1pm 9 9   5pm 8.5   10pm 10         Insulin Sensitivity Factor 12AM 40  6am 45   5pm 30  10pm 40        Target Blood Glucose 12AM 120                 Hypoglycemia:Able to feel low blood sugars.  No glucagon needed recently.  Blood glucose download:  Dexcom CGM Download   Med-alert ID: Not currently wearing. Injection sites: arms and abdomen.  Annual labs due: 10/2022 Ophthalmology due: 2023, discussed importance today.      3. ROS: Greater than 10 systems reviewed with pertinent positives listed in HPI, otherwise neg. Constitutional: Sleeping well.  Eyes: No changes in vision. No blurry vision.  Ears/Nose/Mouth/Throat: No difficulty swallowing. Cardiovascular: No palpitations. No chest pain  Respiratory: No increased work of breathing Gastrointestinal: No constipation or diarrhea. No abdominal pain Genitourinary: No nocturia, no polyuria Neurologic: Normal sensation, no tremor Endocrine: No polydipsia.  No hyperpigmentation Psychiatric: Normal affect. Denies anxiety and depression.   Past Medical History:   Past Medical History:  Diagnosis Date   Diabetes mellitus without complication ([place])    Eczema     Medications:  Outpatient Encounter Medications as of 02/22/2022  Medication Sig Note   NOVOLOG 100 UNIT/ML injection INJECT UP TO 300 UNITS INTO PUMP EVERY 48 HOURS    acetone, urine, test strip 1 strip by Does not apply route as needed for high blood sugar. (Patient not taking:  Reported on 05/04/2021) 12/31/2019: PRN   BD PEN NEEDLE NANO 2ND GEN 32G X 4 MM MISC USE TO INJECT INSULIN AS DIRECTED. (Patient not taking: Reported on 05/04/2021) 12/31/2019: PRN pump failure   Blood Glucose Monitoring Suppl (ONETOUCH VERIO FLEX SYSTEM) w/Device KIT Use to check glucose (Patient not taking: Reported on 05/04/2021)    diclofenac (VOLTAREN) 75 MG EC tablet Take 1 tablet (75 mg total) by mouth 2 (two) times daily. (Patient not taking: Reported on 05/04/2021)    glucagon (GLUCAGON EMERGENCY) 1 MG injection Inject 1 mg into the vein once as needed. (Patient not taking: Reported on  05/04/2021)    Glucagon (GVOKE HYPOPEN 2-PACK) 1 MG/0.2ML SOAJ Inject 1 Dose into the skin as needed. (Patient not taking: Reported on 08/10/2021)    insulin glargine (SEMGLEE, YFGN,) 100 UNIT/ML Solostar Pen INJECT UP TO 50 UNITS SUBCUTANEOUSLY DAILY (Patient not taking: Reported on 02/22/2022)    Insulin Human (INSULIN PUMP) SOLN Inject into the skin. (Patient not taking: Reported on 05/04/2021)    insulin lispro (HUMALOG KWIKPEN) 100 UNIT/ML KwikPen INJECT UP TO 50 UNITS PER DAY (Patient not taking: Reported on 05/04/2021)    LANTUS SOLOSTAR 100 UNIT/ML Solostar Pen INJECT UP TO 50 UNITS SUBCUTANEOUSLY DAILY (Patient not taking: Reported on 08/10/2021)    NOVOLOG FLEXPEN 100 UNIT/ML FlexPen USE UP TO 50 UNITS DAILY (Patient not taking: Reported on 02/22/2022)    ONE TOUCH LANCETS MISC Use to check blood sugar up to six times a day. (Patient not taking: Reported on 05/04/2021)    ONETOUCH VERIO test strip CHECK BLOOD SUGAR 6 TIMES DAILY. (Patient not taking: Reported on 11/23/2021)    Vitamin D, Ergocalciferol, (DRISDOL) 1.25 MG (50000 UNIT) CAPS capsule TAKE 1 CAPSULE BY MOUTH ONE TIME PER WEEK (Patient not taking: Reported on 08/10/2021)    [DISCONTINUED] cetirizine (ZYRTEC) 10 MG tablet Take 10 mg by mouth daily as needed for allergies. Reported on 07/22/2015 (Patient not taking: Reported on 12/31/2019)    No facility-administered encounter medications on file as of 02/22/2022.    Allergies: Allergies  Allergen Reactions   Apple Fiber Anaphylaxis    Swelling of face and throat when eats apple peelings   Cherry     Throat and lip swelling     Surgical History: Past Surgical History:  Procedure Laterality Date   WISDOM TOOTH EXTRACTION      Family History:  Family History  Problem Relation Age of Onset   Graves' disease Mother    Hypertension Mother    Hypertension Father       Social History: Lives with: Mother and father  Starting Cosmetology school  Physical Exam:  Vitals:    02/22/22 0909  BP: 108/62  Pulse: 91  Weight: 129 lb 6.4 oz (58.7 kg)       BP 108/62   Pulse 91   Wt 129 lb 6.4 oz (58.7 kg)   BMI 26.65 kg/m  Body mass index: body mass index is 26.65 kg/m. Growth %ile SmartLinks can only be used for patients less than 55 years old.  Ht Readings from Last 3 Encounters:  10/06/20 4' 10.43" (1.484 m)  07/30/19 4' 10.74" (1.492 m) (1 %, Z= -2.17)*  03/12/19 4' 10.54" (1.487 m) (1 %, Z= -2.25)*   * Growth percentiles are based on CDC (Girls, 2-20 Years) data.   Wt Readings from Last 3 Encounters:  02/22/22 129 lb 6.4 oz (58.7 kg)  11/23/21 129 lb 3.2 oz (58.6 kg)  08/10/21 121 lb  12.8 oz (55.2 kg)    Physical Exam.  General: Well developed, well nourished female in no acute distress.   Head: Normocephalic, atraumatic.   Eyes:  Pupils equal and round. EOMI.   Sclera white.  No eye drainage.   Ears/Nose/Mouth/Throat: Nares patent, no nasal drainage.  Normal dentition, mucous membranes moist.   Neck: supple, no cervical lymphadenopathy, no thyromegaly Cardiovascular: regular rate, normal S1/S2, no murmurs Respiratory: No increased work of breathing.  Lungs clear to auscultation bilaterally.  No wheezes. Abdomen: soft, nontender, nondistended. No appreciable masses  Extremities: warm, well perfused, cap refill < 2 sec.   Musculoskeletal: Normal muscle mass.  Normal strength Skin: warm, dry.  No rash or lesions. Neurologic: alert and oriented, normal speech, no tremor    Labs:  Lab Results  Component Value Date   HGBA1C 6.9 (A) 02/22/2022   Results for orders placed or performed in visit on 02/22/22  POCT glycosylated hemoglobin (Hb A1C)  Result Value Ref Range   Hemoglobin A1C 6.9 (A) 4.0 - 5.6 %   HbA1c POC (<> result, manual entry)     HbA1c, POC (prediabetic range)     HbA1c, POC (controlled diabetic range)    POCT Glucose (Device for Home Use)  Result Value Ref Range   Glucose Fasting, POC     POC Glucose 158 (A) 70 - 99  mg/dl    Assessment/Plan: [name] is a 21 y.o. female with type 1 diabetes recently started on TSlim insulin pump. Blood sugars are well controlled with 70% of time in range and minimal hypoglycemia. She is due for annual labs and hemoglobin A1c today.    1-2. DM w/o complication type I, uncontrolled (HCC)/HyperglycemiaI - Reviewed insulin pump and CGM download. Discussed trends and patterns.  - Rotate pump sites to prevent scar tissue.  - bolus 15 minutes prior to eating to limit blood sugar spikes.  - Reviewed carb counting and importance of accurate carb counting.  - Discussed signs and symptoms of hypoglycemia. Always have glucose available.  - POCT glucose and hemoglobin A1c  - Reviewed growth chart.    3. Hypovitaminosis D  - 2000 units of vitamin D per day  - Repeat 25 OH vit D at next visit.   4 Insulin pump titration  No change, pump in place.  - Back up plan  - Lantus 17 units  - Novolog 120/40/8   5. Syncope  - Discussed referral to Neurology. However since she has not had further episodes and reports feeling well currently, she declined referral.  - Advised to contact me if any concerns arise.   Follow-up:   3 months.     LOS: >40  spent today reviewing the medical chart, counseling the patient/family, and documenting today's visit.      When a patient is on insulin, intensive monitoring of blood glucose levels is necessary to avoid hyperglycemia and hypoglycemia. Severe hyperglycemia/hypoglycemia can lead to hospital admissions and be life threatening.     Hermenia Bers,  FNP-C  Pediatric Specialist  [address] [place]  [place], [address]  Tele: [phone]

## 2022-02-22 NOTE — Patient Instructions (Signed)
It was a pleasure seeing you in clinic today. Please do not hesitate to contact me if you have questions or concerns.   Please sign up for MyChart. This is a communication tool that allows you to send an email directly to me. This can be used for questions, prescriptions and blood sugar reports. We will also release labs to you with instructions on MyChart. Please do not use MyChart if you need immediate or emergency assistance. Ask our wonderful front office staff if you need assistance.   - No changes to pump settings  - A1c is 6.9%  - Go see the eye doctor  - Let me know if you have anymore syncope episodes

## 2022-03-26 ENCOUNTER — Encounter (INDEPENDENT_AMBULATORY_CARE_PROVIDER_SITE_OTHER): Payer: Self-pay

## 2022-04-07 DIAGNOSIS — E108 Type 1 diabetes mellitus with unspecified complications: Secondary | ICD-10-CM | POA: Diagnosis not present

## 2022-04-07 DIAGNOSIS — L0591 Pilonidal cyst without abscess: Secondary | ICD-10-CM | POA: Diagnosis not present

## 2022-04-13 DIAGNOSIS — E108 Type 1 diabetes mellitus with unspecified complications: Secondary | ICD-10-CM | POA: Diagnosis not present

## 2022-04-13 DIAGNOSIS — L0501 Pilonidal cyst with abscess: Secondary | ICD-10-CM | POA: Diagnosis not present

## 2022-05-11 DIAGNOSIS — E109 Type 1 diabetes mellitus without complications: Secondary | ICD-10-CM | POA: Diagnosis not present

## 2022-05-11 DIAGNOSIS — Z794 Long term (current) use of insulin: Secondary | ICD-10-CM | POA: Diagnosis not present

## 2022-06-07 ENCOUNTER — Encounter (INDEPENDENT_AMBULATORY_CARE_PROVIDER_SITE_OTHER): Payer: Self-pay | Admitting: Family

## 2022-06-07 ENCOUNTER — Ambulatory Visit (INDEPENDENT_AMBULATORY_CARE_PROVIDER_SITE_OTHER): Payer: 59 | Admitting: Family

## 2022-06-07 VITALS — BP 112/70 | HR 64 | Wt 133.6 lb

## 2022-06-07 DIAGNOSIS — E1065 Type 1 diabetes mellitus with hyperglycemia: Secondary | ICD-10-CM | POA: Diagnosis not present

## 2022-06-07 DIAGNOSIS — F4320 Adjustment disorder, unspecified: Secondary | ICD-10-CM

## 2022-06-07 DIAGNOSIS — Z4681 Encounter for fitting and adjustment of insulin pump: Secondary | ICD-10-CM

## 2022-06-07 DIAGNOSIS — E559 Vitamin D deficiency, unspecified: Secondary | ICD-10-CM

## 2022-06-07 LAB — POCT GLUCOSE (DEVICE FOR HOME USE): Glucose Fasting, POC: 140 mg/dL — AB (ref 70–99)

## 2022-06-07 LAB — POCT GLYCOSYLATED HEMOGLOBIN (HGB A1C): Hemoglobin A1C: 7.3 % — AB (ref 4.0–5.6)

## 2022-06-07 NOTE — Progress Notes (Signed)
Pediatric Endocrinology Diabetes Consultation Follow-up Visit  [name] 12-20-2000 [id number]  Chief Complaint: Follow-up type 1 diabetes   Patient, No Pcp Per   HPI: [name]  is a 22 y.o. female presenting for follow-up of type 1 diabetes. she is accompanied to this visit by her mother and father.  [name] presented to Urgent care on 03/19/15 after having vomiting. She had a 2-3 week history of polyuria and polydipsia. She was also more tired for weeks prior to her admission and had been losing weight. She was transferred to [facility] with a glucose of >600 and pH of 6.895. She was treated with the 2 bag method. She has no other family members with type 1 diabetes. Mom has thyroid disease. She was discharged on 03/23/15 on Lantus and Novolog. Her pancreatic islet cell antibodies were positive.   2. Since last visit to [facility] on 01/2022 , she has been well.  No ER visits or hospitalizations.  Reports she is very stressed, looking for jobs and school will be restarting school on the 16th. She is having a hard time dealing with her stress and is interested in counseling.   Using Tandem TSlim insulin pump and Dexcom CGm. Blood sugars have been higher lately which she thinks is due to stress. Also reports eating late at night.  She boluses 15 minutes before eating and estimates carbs well. Hypoglycemia is rare, none severe.   Taking 2000 units of Vitamin D3 daily.    No recent syncopal episodes.   Insulin regimen: 17 units of Basaglar. Humalog  120/40/8 plan.. TSLIM insulin pump 12AM 0.73  6am 1pm 0.65 0.73  5pm 0.75  10pm 0.70     17 units per day   Insulin to Carbohydrate Ratio 12AM 10  6am 1pm 9 9   5pm 8.5   10pm 10        Insulin Sensitivity Factor 12AM 40  6am 45   5pm 30  10pm 40       Target Blood Glucose 12AM 120                 Hypoglycemia:Able to feel low blood sugars.  No glucagon needed recently.  Blood glucose download:  Dexcom CGM Download    -  Report shows pattern of hyperglycemia following bedtime snack from 11pm-4am.   Med-alert ID: Not currently wearing. Injection sites: arms and abdomen.  Annual labs due: 10/2022 Ophthalmology due: 2023, discussed importance today.      3. ROS: Greater than 10 systems reviewed with pertinent positives listed in HPI, otherwise neg. Constitutional: Sleeping well.  Eyes: No changes in vision. No blurry vision.  Ears/Nose/Mouth/Throat: No difficulty swallowing. Cardiovascular: No palpitations. No chest pain  Respiratory: No increased work of breathing Gastrointestinal: No constipation or diarrhea. No abdominal pain Genitourinary: No nocturia, no polyuria Neurologic: Normal sensation, no tremor Endocrine: No polydipsia.  No hyperpigmentation Psychiatric: Normal affect. Denies anxiety and depression.   Past Medical History:   Past Medical History:  Diagnosis Date   Diabetes mellitus without complication ([place])    Eczema     Medications:  Outpatient Encounter Medications as of 06/07/2022  Medication Sig Note   acetone, urine, test strip 1 strip by Does not apply route as needed for high blood sugar. (Patient not taking: Reported on 05/04/2021) 12/31/2019: PRN   BD PEN NEEDLE NANO 2ND GEN 32G X 4 MM MISC USE TO INJECT INSULIN AS DIRECTED. (Patient not taking: Reported on 05/04/2021) 12/31/2019: PRN pump failure   Blood  Glucose Monitoring Suppl (ONETOUCH VERIO FLEX SYSTEM) w/Device KIT Use to check glucose (Patient not taking: Reported on 05/04/2021)    diclofenac (VOLTAREN) 75 MG EC tablet Take 1 tablet (75 mg total) by mouth 2 (two) times daily. (Patient not taking: Reported on 05/04/2021)    glucagon (GLUCAGON EMERGENCY) 1 MG injection Inject 1 mg into the vein once as needed. (Patient not taking: Reported on 05/04/2021)    Glucagon (GVOKE HYPOPEN 2-PACK) 1 MG/0.2ML SOAJ Inject 1 Dose into the skin as needed. (Patient not taking: Reported on 08/10/2021)    insulin glargine (SEMGLEE, YFGN,) 100  UNIT/ML Solostar Pen INJECT UP TO 50 UNITS SUBCUTANEOUSLY DAILY (Patient not taking: Reported on 02/22/2022)    Insulin Human (INSULIN PUMP) SOLN Inject into the skin. (Patient not taking: Reported on 05/04/2021)    insulin lispro (HUMALOG KWIKPEN) 100 UNIT/ML KwikPen INJECT UP TO 50 UNITS PER DAY (Patient not taking: Reported on 05/04/2021)    LANTUS SOLOSTAR 100 UNIT/ML Solostar Pen INJECT UP TO 50 UNITS SUBCUTANEOUSLY DAILY (Patient not taking: Reported on 08/10/2021)    NOVOLOG 100 UNIT/ML injection INJECT UP TO 300 UNITS INTO PUMP EVERY 48 HOURS    NOVOLOG FLEXPEN 100 UNIT/ML FlexPen USE UP TO 50 UNITS DAILY (Patient not taking: Reported on 02/22/2022)    ONE TOUCH LANCETS MISC Use to check blood sugar up to six times a day. (Patient not taking: Reported on 05/04/2021)    ONETOUCH VERIO test strip CHECK BLOOD SUGAR 6 TIMES DAILY. (Patient not taking: Reported on 11/23/2021)    Vitamin D, Ergocalciferol, (DRISDOL) 1.25 MG (50000 UNIT) CAPS capsule TAKE 1 CAPSULE BY MOUTH ONE TIME PER WEEK (Patient not taking: Reported on 08/10/2021)    [DISCONTINUED] cetirizine (ZYRTEC) 10 MG tablet Take 10 mg by mouth daily as needed for allergies. Reported on 07/22/2015 (Patient not taking: Reported on 12/31/2019)    No facility-administered encounter medications on file as of 06/07/2022.    Allergies: Allergies  Allergen Reactions   Apple Fiber Anaphylaxis    Swelling of face and throat when eats apple peelings   Cherry     Throat and lip swelling     Surgical History: Past Surgical History:  Procedure Laterality Date   WISDOM TOOTH EXTRACTION      Family History:  Family History  Problem Relation Age of Onset   Graves' disease Mother    Hypertension Mother    Hypertension Father       Social History: Lives with: Mother and father  Starting Cosmetology school  Physical Exam:  There were no vitals filed for this visit.      There were no vitals taken for this visit. Body mass index: body  mass index is unknown because there is no height or weight on file. Growth %ile SmartLinks can only be used for patients less than 51 years old.  Ht Readings from Last 3 Encounters:  10/06/20 4' 10.43" (1.484 m)  07/30/19 4' 10.74" (1.492 m) (1 %, Z= -2.17)*  03/12/19 4' 10.54" (1.487 m) (1 %, Z= -2.25)*   * Growth percentiles are based on CDC (Girls, 2-20 Years) data.   Wt Readings from Last 3 Encounters:  02/22/22 129 lb 6.4 oz (58.7 kg)  11/23/21 129 lb 3.2 oz (58.6 kg)  08/10/21 121 lb 12.8 oz (55.2 kg)    Physical Exam.  General: Well developed, well nourished female in no acute distress.   Head: Normocephalic, atraumatic.   Eyes:  Pupils equal and round. EOMI.   Sclera  white.  No eye drainage.   Ears/Nose/Mouth/Throat: Nares patent, no nasal drainage.  Normal dentition, mucous membranes moist.   Neck: supple, no cervical lymphadenopathy, no thyromegaly Cardiovascular: regular rate, normal S1/S2, no murmurs Respiratory: No increased work of breathing.  Lungs clear to auscultation bilaterally.  No wheezes. Abdomen: soft, nontender, nondistended. No appreciable masses  Extremities: warm, well perfused, cap refill < 2 sec.   Musculoskeletal: Normal muscle mass.  Normal strength Skin: warm, dry.  No rash or lesions. Neurologic: alert and oriented, normal speech, no tremor    Labs:  Lab Results  Component Value Date   HGBA1C 6.9 (A) 02/22/2022   Results for orders placed or performed in visit on 02/22/22  POCT glycosylated hemoglobin (Hb A1C)  Result Value Ref Range   Hemoglobin A1C 6.9 (A) 4.0 - 5.6 %   HbA1c POC (<> result, manual entry)     HbA1c, POC (prediabetic range)     HbA1c, POC (controlled diabetic range)    POCT Glucose (Device for Home Use)  Result Value Ref Range   Glucose Fasting, POC     POC Glucose 158 (A) 70 - 99 mg/dl    Assessment/Plan: [name] is a 22 y.o. female with type 1 diabetes  on TSlim insulin pump and Dexcom CGM. She is having a  pattern of hyperglycemia between 11pm-4am which she eats late at night. Hemoglobin A1c is 7.3% which is higher then ADA goal of <7%.  y.    1-2. DM w/o complication type I, uncontrolled (HCC)/HyperglycemiaI - Reviewed insulin pump and CGM download. Discussed trends and patterns.  - Rotate pump sites to prevent scar tissue.  - bolus 15 minutes prior to eating to limit blood sugar spikes.  - Reviewed carb counting and importance of accurate carb counting.  - Discussed signs and symptoms of hypoglycemia. Always have glucose available.  - POCT glucose and hemoglobin A1c  - Reviewed growth chart.  - Discussed to update pump to 7.7 software and start Dexcom G7 sensors.   3. Hypovitaminosis D  - 2000 units of vitamin D per day  - 25 OH vitamin D ordered   4 Insulin pump titration  12AM 0.73  6am 1pm 0.65--> 0.70  0.73--> 0.80   5pm 0.75--> 0.80   10pm 0.70--> 0.8     17.9 units per day   - Lantus 17 units  - Novolog 120/40/8   5, Adjustment reaction  - Referral to see behavioral health clinician Shanda Bumps.   Follow-up:   3 months.     LOS: >40  spent today reviewing the medical chart, counseling the patient/family, and documenting today's visit.     When a patient is on insulin, intensive monitoring of blood glucose levels is necessary to avoid hyperglycemia and hypoglycemia. Severe hyperglycemia/hypoglycemia can lead to hospital admissions and be life threatening.     Gretchen Short,  FNP-C  Pediatric Specialist  [address]  [place], [address]  Tele: [phone]

## 2022-06-07 NOTE — Patient Instructions (Addendum)
It was a pleasure seeing you in clinic today. Please do not hesitate to contact me if you have questions or concerns.   Please sign up for MyChart. This is a communication tool that allows you to send an email directly to me. This can be used for questions, prescriptions and blood sugar reports. We will also release labs to you with instructions on MyChart. Please do not use MyChart if you need immediate or emergency assistance. Ask our wonderful front office staff if you need assistance.    12AM 0.73  6am 1pm 0.65--> 0.70  0.73--> 0.80   5pm 0.75--> 0.80   10pm 0.70--> 0.8     17.9 units per day

## 2022-06-08 LAB — VITAMIN D 25 HYDROXY (VIT D DEFICIENCY, FRACTURES): Vit D, 25-Hydroxy: 34 ng/mL (ref 30–100)

## 2022-06-11 NOTE — Progress Notes (Unsigned)
Integrated Behavioral Health Initial In-Person Visit  MRN: [id number] Name: [name]  Number of [place] Clinician visits:: 1/6 Session Start time: ***  Session End time: *** Total time: {IBH Total Time:21014050} minutes  Types of Service: {CHL AMB TYPE OF [phone]}  Interpretor:{yes [id number]} Interpretor Name and Language: ***   Warm Hand Off Completed.        Subjective: [name] is a 22 y.o.  accompanied by {CHL AMB ACCOMPANIED AO:[id number]} Patient was referred by *** for ***. Patient reports the following symptoms/concerns: *** Duration of problem: ***; Severity of problem: {Mild/Moderate/Severe:20260}  Objective: Mood: {BHH MOOD:22306} and Affect: {BHH AFFECT:22307} Risk of harm to self or others: {CHL AMB BH Suicide Current Mental Status:21022748}  Life Context: Family and Social: *** School/Work: *** Self-Care: *** Life Changes: ***  Patient and/or Family's Strengths/Protective Factors: {CHL AMB BH PROTECTIVE FACTORS:[phone]}  Goals Addressed: Patient will: Reduce symptoms of: {IBH Symptoms:21014056} Increase knowledge and/or ability of: {IBH Patient Tools:21014057}  Demonstrate ability to: {IBH Goals:21014053}  Progress towards Goals: {CHL AMB BH PROGRESS TOWARDS [phone]}  Interventions: Interventions utilized: {IBH Interventions:21014054}  Standardized Assessments completed: {IBH Screening Tools:21014051}  Patient and/or Family Response: ***  Patient Centered Plan: Patient is on the following Treatment Plan(s):  ***  Assessment: Patient currently experiencing ***.   Patient may benefit from ***.  Plan: Follow up with behavioral health clinician on : *** Behavioral recommendations: *** Referral(s): {IBH Referrals:21014055} "From scale of 1-10, how likely are you to follow plan?": ***  Valda Favia

## 2022-06-14 ENCOUNTER — Encounter (INDEPENDENT_AMBULATORY_CARE_PROVIDER_SITE_OTHER): Payer: 59 | Admitting: Licensed Clinical Social Worker

## 2022-06-14 ENCOUNTER — Encounter (INDEPENDENT_AMBULATORY_CARE_PROVIDER_SITE_OTHER): Payer: Self-pay | Admitting: Licensed Clinical Social Worker

## 2022-06-14 ENCOUNTER — Ambulatory Visit (INDEPENDENT_AMBULATORY_CARE_PROVIDER_SITE_OTHER): Payer: 59 | Admitting: Licensed Clinical Social Worker

## 2022-06-14 DIAGNOSIS — F321 Major depressive disorder, single episode, moderate: Secondary | ICD-10-CM | POA: Diagnosis not present

## 2022-06-14 DIAGNOSIS — F419 Anxiety disorder, unspecified: Secondary | ICD-10-CM | POA: Diagnosis not present

## 2022-06-14 DIAGNOSIS — F411 Generalized anxiety disorder: Secondary | ICD-10-CM | POA: Diagnosis not present

## 2022-06-14 DIAGNOSIS — F32A Depression, unspecified: Secondary | ICD-10-CM

## 2022-06-14 NOTE — BH Specialist Note (Signed)
A user error has taken place: encounter opened in error, closed for administrative reasons.

## 2022-06-14 NOTE — Progress Notes (Signed)
A user error has taken place: encounter opened in error, closed for administrative reasons.

## 2022-06-14 NOTE — BH Specialist Note (Signed)
Error

## 2022-06-14 NOTE — Progress Notes (Signed)
See [facility] Specialist Note by Bonnita Nasuti for this visit.

## 2022-06-14 NOTE — BH Specialist Note (Addendum)
Integrated Behavioral Health Initial In-Person Visit  MRN: [id number] Name: [name]  Number of [place] Clinician visits: 1/6 Session Start time: 11:12 am Session End time: 12:12 pm Total time in minutes: 60  Types of Service: Individual psychotherapy  Interpretor:No.    Subjective: [name] is a 22 y.o. female accompanied by Self Patient was referred by Hermenia Bers for patient expressed life stressors associated with school and work.  Patient reports the following symptoms/concerns: Patient reports last year was the "worst year of my life." Patients reports moments of sadness both persistent and coming in waves. Patient reports a history of difficulty with learning in school and the pace at which she understands and retains the information learned. Patient reports experiencing mood swings, easily irritated, and feels misunderstood when she tries to explain her feelings causing her to shut down. Patient reports feeling stuck. Patient reports a history of symptoms relating to panic attacks reporting her chest starts burning and she would cry for what felt like "no reason." Patient reports taking a leave from school and is expected to return on 06/15/22. Patient reports feeling unsure if she wants to return due to fears and worries associated with her performance and if she will do well.Patient reports a history of difficulty in school since third grade.   Duration of problem: Patient reports noticing more symptoms in the past year. Patients reports experiencing moments of sadness since she was in third grade; Severity of problem: severe  Objective: Mood: Euthymic and Affect: Appropriate and at times tearful Risk of harm to self or others: No plan to harm self or others  Life Context: Family and Social: Patients reports even though it can be hard for her to talk about her feelings, she has experience expressing feelings to her mother, father, and boyfriend.  Patient reports she does not have friends outside of her boyfriend.    School/Work: Patients reports being unsure if she will return to school on 06/15/22. Patient reports she currently does not have a job. School and work are contributing stressors.  Self-Care: Patient reports exploring different study methods to prepare for school and finding one she liked. Patient reports after using this method, she made a 100 on the practice exam her mother administered to her.   Life Changes: Patient is currently unemployed and reports upcoming bills she has to pay. Patient is expected to begin school again on 06/15/22.   Patient and/or Family's Strengths/Protective Factors: Social and Emotional competence and Concrete supports in place (healthy food, safe environments, etc.)  Goals Addressed: Patient will: Reduce symptoms of: anxiety, depression, and stress Increase knowledge and/or ability of: the impacts of depression and anxiety on an individuals level of functioning. Began providing psycho-education about depression and anxiety symptom impacts on daily living. Demonstrate ability to: Increase healthy adjustment to current life circumstances  Progress towards Goals: Ongoing  Interventions: Interventions utilized: Motivational Interviewing, CBT Cognitive Behavioral Therapy, and Psychoeducation and/or Health Education  Standardized Assessments completed: PHQ-SADS       06/14/2022    9:21 AM  PHQ-SADS Last 3 Score only  PHQ-15 Score 15  Total GAD-7 Score 16  PHQ Adolescent Score 19    Patient and/or Family Response: Patient responsive to assessment and interventions used.   Patient Centered Plan: Patient is on the following Treatment Plan(s): Continue psychoeducation about the impacts of depression and anxiety symptoms on level of functioning, provide psychoeducation about coping and relaxation skills, begin building and developing coping skill plan for regular use.  Assessment: Patient  currently experiencing severe levels of moderate to severe levels of anxiety and depression.   Patient will benefit from ongoing [place] in clinic.  Plan: Follow up with behavioral health clinician on : 06/21/22 Behavioral recommendations: See treatment plan. Referral(s): [place] (In Clinic)   Valda Favia, [place]

## 2022-06-21 ENCOUNTER — Ambulatory Visit (INDEPENDENT_AMBULATORY_CARE_PROVIDER_SITE_OTHER): Payer: 59 | Admitting: Licensed Clinical Social Worker

## 2022-06-21 DIAGNOSIS — F411 Generalized anxiety disorder: Secondary | ICD-10-CM | POA: Diagnosis not present

## 2022-06-21 DIAGNOSIS — F32A Depression, unspecified: Secondary | ICD-10-CM

## 2022-06-21 DIAGNOSIS — F321 Major depressive disorder, single episode, moderate: Secondary | ICD-10-CM

## 2022-06-21 NOTE — BH Specialist Note (Signed)
Integrated Behavioral Health Follow Up In-Person Visit  MRN: [id number] Name: [name]  Number of [place] Clinician visits: 2/6 Session Start time: 11:36 am Session End time: 12:18 pm Total time in minutes: 42 min  Types of Service: Individual psychotherapy No. Interpretor:No.   Subjective: [name] is a 22 y.o. female accompanied by  Self Patient was referred by Hermenia Bers for patient expressed life stressors associated with school and work. Patient reports the following symptoms/concerns: Patient reports symptoms related to anxiety starting school, reporting she felt anxious and panic the first day. Patient reports waking up the second day with a stomachache that subsided once she got to school. Patient reports feeling irritable at different points at school but is able to work through irritability.Patient reports feeling exhausted every day after school.  Duration of problem: Patient reports noticing more symptoms in the past year. Patients reports experiencing moments of sadness since she was in third grade;Severity of problem: moderate  Objective: Mood: Euthymic and Affect: Appropriate Risk of harm to self or others: No plan to harm self or others  Life Context: Family and Social: Patient reports support from her family as she has navigated starting school last week.Patient reports receiving help from her family with meals and deciding lunches for her short lunch break.  School/Work: Patient started school on 06/15/22 and has been going consistently for the past week.  Self-Care: Patient reports the desire to start journaling and has begun trying to implement this in her week. Life Changes: Client began school after leave in the past week. No reports of new life changes since last appointment.    Patient and/or Family's Strengths/Protective Factors: Concrete supports in place (healthy food, safe environments, etc.)  Goals Addressed: Patient  will:  Reduce symptoms of: anxiety and depression.  Increase knowledge and/or ability of: Continue providing psycho-education about depression and anxiety symptom impacts on daily living and provide patient with coping skills she can practice and use on a regular basis.  Demonstrate ability to: Increase healthy adjustment to current life circumstances, notice thoughts that impact feelings and behaviors, identify coping and relaxation skills.   Progress towards Goals: Ongoing  Interventions: Interventions utilized:  Motivational Interviewing, Solution-Focused Strategies, Mindfulness or Psychologist, educational, CBT Cognitive Behavioral Therapy, and Psychoeducation and/or Health Education Standardized Assessments completed: Not Needed  Patient and/or Family Response: Patient responsive and receptive to treatment intervention methods. Patient voices understanding and agrees to plan until next session.   Patient Centered Plan: Patient is on the following Treatment Plan(s): Patient agreed to thought tracking until next session. Patient will pay attention to her anxious or negative thoughts and write them down. Patient reports she will attempt to journal more this week. Patient will practice grounding technique learned in session:  Grounding technique (5-4-3-2-1)  Using the five senses, patient will pay attention to 5 things she can see, 4 things she can feel, 3 things she can hear, 2 things she can smell and 1 thing she can taste.   Assessment: Patient currently experiencing symptoms related to anxiety and depression. Patient attended school last week and reports she will continue attending. Patient reports she has a new teacher that has been more helpful than what she has experienced in the past. Patient reports the support from the new teacher is helping her experience at school currently.   Patient wil benefit from continued sessions with [place] Clinician.  Plan: Follow up  with behavioral health clinician on : 06/28/22  Behavioral recommendations: Outlined in  treatment plan above.  Referral(s): [place] (In Clinic) Patient agrees to adhere to treatment plan goals set during session.   Valda Favia, LCSW

## 2022-06-28 ENCOUNTER — Ambulatory Visit (INDEPENDENT_AMBULATORY_CARE_PROVIDER_SITE_OTHER): Payer: Self-pay | Admitting: Licensed Clinical Social Worker

## 2022-06-28 NOTE — BH Specialist Note (Deleted)
Integrated Behavioral Health Follow Up In-Person Visit  MRN: [id number] Name: Mikeisha Nixdorf  Number of [place] Clinician visits: Third Visit (3/6) Session Start time: No data recorded  Session End time: No data recorded Total time in minutes: No data recorded  Types of Service: Individual psychotherapy  Interpretor:{yes [id number] Interpretor Name and Language: ***  Subjective: Iridiana Pilson is a 22 y.o. female accompanied by {Patient accompanied by:[phone]} Patient was referred by *** for ***. Patient reports the following symptoms/concerns: *** Duration of problem: ***; Severity of problem: {Mild/Moderate/Severe:20260}  Objective: Mood: {BHH MOOD:22306} and Affect: {BHH AFFECT:22307} Risk of harm to self or others: {CHL AMB BH Suicide Current Mental Status:21022748}  Life Context: Family and Social: *** School/Work: *** Self-Care: *** Life Changes: ***  Patient and/or Family's Strengths/Protective Factors: {CHL AMB BH PROTECTIVE FACTORS:[phone]}  Goals Addressed: Patient will:  Reduce symptoms of: {IBH Symptoms:21014056}   Increase knowledge and/or ability of: {IBH Patient Tools:21014057}   Demonstrate ability to: {IBH Goals:21014053}  Progress towards Goals: {CHL AMB BH PROGRESS TOWARDS GOALS:5594495446}  Interventions: Interventions utilized:  {IBH Interventions:21014054} Standardized Assessments completed: {IBH Screening Tools:21014051}  Patient and/or Family Response: ***  Patient Centered Plan: Patient is on the following Treatment Plan(s): *** Assessment: Patient currently experiencing ***.   Patient may benefit from ***.  Plan: Follow up with behavioral health clinician on : *** Behavioral recommendations: *** Referral(s): {IBH Referrals:21014055} "From scale of 1-10, how likely are you to follow plan?": ***  Valda Favia, LCSW

## 2022-07-05 ENCOUNTER — Ambulatory Visit (INDEPENDENT_AMBULATORY_CARE_PROVIDER_SITE_OTHER): Payer: 59 | Admitting: Licensed Clinical Social Worker

## 2022-07-05 DIAGNOSIS — F411 Generalized anxiety disorder: Secondary | ICD-10-CM | POA: Diagnosis not present

## 2022-07-05 DIAGNOSIS — F321 Major depressive disorder, single episode, moderate: Secondary | ICD-10-CM

## 2022-07-05 NOTE — BH Specialist Note (Unsigned)
Integrated Behavioral Health Follow Up In-Person Visit  MRN: [id number] Name: [name]  Number of [facility] Clinician visits: Third visit Session Start time: 11:37 AM Session End time: 12:19 pm Total time in minutes: 80 Min   Types of Service: Individual psychotherapy  Interpretor:No.   Subjective: [name] is a 22 y.o. female accompanied by  Self.  Patient was referred by Hermenia Bers for patient expressed life stressors associated with school and work.  Subjective: Patient reports symptoms related to a stomach bug last week causing her to miss hours she needed for her school program. Patient reports her teacher sent her home furthering the frustration she was experiencing. Patient reports despite being frustrated, she was able to shift her perspective and use energy to do household chores when she returned home. Patient reports in the recent past this would have disrupted her to the point where she stayed in her room, did nothing and want to quit the program. 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 Patient reports the week before, she passed her test and was really proud of herself. Patient doing well in school despite previous fears of not doing well. Patient reports using 5 senses grounding when she felt stressed over the past week and feeling like that helped her. Patient reports feeling irritated or tired most of the time and feeling misunderstood by her parents as evidenced by her father and mother questioning her or dismissing her when she is trying to express her feelings.   Duration of initial problem: Patient reports noticing more symptoms in the past year. Patient reports a history of sadness; Severity of problem: moderate  Objective: Mood: Euthymic and Affect: Appropriate Risk of harm to self or others: No plan to harm self or others  Patient  and/or Family's Strengths/Protective Factors: Social and Emotional competence, Concrete supports in place (healthy food, safe environments, etc.), and patient resilience.   Goals Addressed: Patient will:  Reduce symptoms of: anxiety and depression   Increase knowledge and/or ability of:  Continue providing psychoeducation about depression and anxiety symptom impacts on daily living and provide patient with coping skills she can practice and use on a regular basis.    Demonstrate ability to: Increase healthy adjustment to current life circumstances and notice thoughts that impact feelings and behaviors, identify coping and relaxation skills.  Progress towards Goals: Ongoing  Interventions: Interventions utilized:  Motivational Interviewing, Solution-Focused Strategies, Mindfulness or Psychologist, educational, Supportive Counseling, Psychoeducation and/or Health Education, and Supportive Reflection Standardized Assessments completed: Not Needed  Patient and/or Family Response: Patient responsive and receptive to treatment intervention methods. Patient expressed the desire to include her parents into the next session for clinician to provide psychoeducation about her symptoms to her parents to promote their understanding of patient experience.   Patient Centered Plan: Patient is on the following Treatment Plan(s): Patient will continue paying attention to her anxious or negative thoughts and write them down. Patient will continue using intervention methods learned in sessions.  Plan: Follow up with behavioral health clinician on: Patient will check parents availability to come with her to the next session then call the office to schedule the appointment Behavioral recommendations: See treatment plan ad goals addressed Referral(s): [place] (In Clinic) Patient agrees to adhere to treatment plan goals set during session.   Valda Favia, LCSW

## 2022-07-19 ENCOUNTER — Ambulatory Visit (INDEPENDENT_AMBULATORY_CARE_PROVIDER_SITE_OTHER): Payer: 59 | Admitting: Licensed Clinical Social Worker

## 2022-07-19 DIAGNOSIS — F411 Generalized anxiety disorder: Secondary | ICD-10-CM

## 2022-07-19 DIAGNOSIS — F321 Major depressive disorder, single episode, moderate: Secondary | ICD-10-CM

## 2022-07-19 NOTE — BH Specialist Note (Signed)
Integrated Behavioral Health Follow Up In-Person Visit  MRN: [id number] Name: [name]  Number of [facility] Clinician visits: Fourth visit  Session Start time: 11:38 am Session End time: 12:38 pm Total time in minutes: 60 min  Types of Service: Family psychotherapy  Interpretor:No.    [name] is a 22 y.o. female accompanied by Father  Patient was referred by Hermenia Bers, NP for expressed life stressors associated with school and work.   Subjective: Patient presented to session with her father. Patient and father both expressed separately patient challenges and difference of opinion as it relates to motivation and mindsets when it comes to school and life choices.   Duration of initial  problem: patient reports noticing more symptoms in the past year. Patients reports experiencing moments of sadness since she was in third grade; Severity of problem: moderate  Objective: Mood: Euphoric and Affect: Appropriate Risk of harm to self or others: No plan to harm self or others  Patient and/or Family's Strengths/Protective Factors: Social and Emotional competence, Concrete supports in place (healthy food, safe environments, etc.), and patient resilience  Goals Addressed: Patient will:  Reduce symptoms of: anxiety and depression   Increase knowledge and/or ability of:  Continue providing psychoeducation about depression and anxiety symptom impacts on daily living and provide patient with coping skills she can practice and use on a regular basis    Demonstrate ability to: Increase healthy adjustment to current life circumstances and notice thoughts that impact feelings and behaviors, identify coping and relaxation skills.  Progress towards Goals: Ongoing  Interventions: Interventions utilized:  Motivational Interviewing, Solution-Focused Strategies, Supportive Counseling, Psychoeducation and/or Health Education, and Supportive Reflection Standardized  Assessments completed: Not Needed  Reviewed patients assessment results with father, per patient permission. Provided psychoeducation to patients father about how depression and anxiety impact patients daily functions and ability to make choices. Processed patients father experience as his desire to care for patient and want the best for her while also providing insight and interventions to patients father that could be more helpful for patient to receive, including more positive praise and ways he can encourage her to use skills.   Patient and/or Family Response: Patient and father responsive to clinician assessment and intervention methods. Patients father was an active participant in session and willing to listen to patient and be open to her thoughts.  Patient Centered Plan: Patient is on the following Treatment Plan(s): Patient will continue paying attention to her anxious and negative thoughts and write them down. Patient will continue using intervention methods learned in sessions.    Plan: Follow up with behavioral health clinician on : 2 weeks Behavioral recommendations: See goals addressed and treatment plan. Referral(s): [place] (In Clinic) "From scale of 1-10, how likely are you to follow plan?": very likely  Valda Favia, LCSW

## 2022-08-02 ENCOUNTER — Ambulatory Visit (INDEPENDENT_AMBULATORY_CARE_PROVIDER_SITE_OTHER): Payer: 59 | Admitting: Licensed Clinical Social Worker

## 2022-08-02 DIAGNOSIS — F411 Generalized anxiety disorder: Secondary | ICD-10-CM

## 2022-08-02 DIAGNOSIS — F321 Major depressive disorder, single episode, moderate: Secondary | ICD-10-CM

## 2022-08-02 NOTE — BH Specialist Note (Unsigned)
Integrated Behavioral Health Follow Up In-Person Visit  MRN: [id number] Name: [name]  Number of [place] Clinician visits:Fifth Visit (5/6) Session Start time: 11:33 am Session End time: 12:10 pm Total time in minutes: 37 min  Types of Service: Individual psychotherapy  Interpretor:No.    [name] is a 22 y.o. female accompanied by  self.  Patient was referred by Hermenia Bers, NP for expressed life stressors associated with school and work.   Subjective: -Patient reports failing her test 3 weeks ago -Patient reports since failing her test, she has started vaping, has been overeating/struggling with binge eating, is missing classes and not studying the class material -Patient reports she has spiraled since failing the test and feels like her cycle of depression has started all over again. -Patient reports the thought that came to her mind was that she was a failure after failing the test.   Duration of initial problem: Patient reports noticing more symptoms in the past year. Patient reports  experiencing moments of sadness since she was in third grade. ; Severity of problem: moderate  Objective: Mood: Euthymic and Affect: Appropriate Risk of harm to self or others: No plan to harm self or others  Patient and/or Family's Strengths/Protective Factors: Social and Emotional competence, Concrete supports in place (healthy food, safe environments, etc.), and patient resilience  Goals Addressed: Patient will:  Reduce symptoms of: anxiety and depression   Increase knowledge and/or ability of:  continue providing psychoeducation about depression and anxiety symptom impacts on daily living and provide patient with coping skills she can practice and use on a regular basis    Demonstrate ability to: Increase healthy adjustment to current life circumstances and notice thoughts that impact feelings and behaviors, identify coping and relaxation  skills  Progress towards Goals: Ongoing  Interventions: Interventions utilized:  Motivational Interviewing, Solution-Focused Strategies, Supportive Counseling, Psychoeducation and/or Health Education, and Supportive Reflection Standardized Assessments completed: Not Needed  Engaged patient in CBT intervention methods to trace the thought that originated in her mind after failing the test. Patient reports thinking she is a failure. Explored the thoughts feelings and behaviors that came from the thought.  Patient and/or Family Response:  Patient able to identify and process. Patient reports a history of this thought as it relates to whether she does well on something or not. Patient reports this relates to a history of feeling like the things she does has to be perfect. Patient open and receptive to clinicians assessment and intervention.  Patient Centered Plan: Patient is on the following Treatment Plan(s): Patient will continue paying attention to her anxious and negative thoughts. Patient will begin intentionally reframing the negative thought by analyzing if the thought is true and using a truth to combat the thought. Patient will write affirmations on a sticky note to put somewhere she frequently goes to [employer], car, etc). Patient would like to continue services with clinician past six visits. Clinician will start prepping patients CCA.    Plan: Follow up with behavioral health clinician on : two weeks Behavioral recommendations: See goals addressed and treatment plan Referral(s): [place] (In Clinic) "From scale of 1-10, how likely are you to follow plan?": "very likely"  Valda Favia, LCSW

## 2022-08-16 ENCOUNTER — Ambulatory Visit (INDEPENDENT_AMBULATORY_CARE_PROVIDER_SITE_OTHER): Payer: 59 | Admitting: Licensed Clinical Social Worker

## 2022-08-16 DIAGNOSIS — F321 Major depressive disorder, single episode, moderate: Secondary | ICD-10-CM | POA: Diagnosis not present

## 2022-08-16 DIAGNOSIS — F411 Generalized anxiety disorder: Secondary | ICD-10-CM | POA: Diagnosis not present

## 2022-08-16 NOTE — BH Specialist Note (Unsigned)
ADULT Comprehensive Clinical Assessment (CCA) Note   08/16/2022 Henlie Shill [id number]   Referring Provider: Hermenia Bers, NP Session Start time: 11:15 am Session End time:  12:15 pm  Total time in minutes: 60 min.  SUBJECTIVE: [name] is a 22 y.o.   female accompanied by  self  Lerae Counce was seen in consultation at the request of Patient, No Pcp Per for evaluation of  further treatment for [facility] services.  Types of Service: Individual psychotherapy  Reason for referral in patient/family's own words:  For life stressors and how they impact patient mental capacity specifically when it comes to managing her symptoms of anxiety and depression.     She likes to be called [place].  She came to the appointment with  self .  Primary language at home is [place].  Constitutional Appearance: cooperative, well-nourished, well-developed, alert and well-appearing  (Patient to answer as appropriate) Gender identity: Female  Sex assigned at birth: Female Pronouns: she   Mental status exam:   General Appearance [place]:  Neat Eye Contact:  Good Motor Behavior:  Normal Speech:  Normal Level of Consciousness:  Alert Mood:  Euthymic Affect:  Appropriate Anxiety Level:  None Thought Process:  Coherent Thought Content:  WNL Perception:  Normal Judgment:  Good Insight:  Present   Current Medications and therapies: She is taking:   Vitamin D,daily,  Novolog 100 ML, daily, benedryl, half as needed.       Therapies:  None  Family history: Family mental illness:   unknown history Family school achievement history:  No known history of autism, learning disability, intellectual disability Other relevant family history:  No known history of substance use or alcoholism  Social History: Now living with mother, father, and brother age 31 . Parents have a good relationship in home together. 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 PHQ-SADS 08/16/2022 administered by LCSW POSITIVE for somatic, anxiety, depressive symptoms  Negative Mood Concerns Has made negative comments about self in the past but has improved since beginning service  . Self-injury:  No Suicidal ideation:  No Suicide attempt:  No  Additional Anxiety Concerns: Panic attacks:  Yes-past history of panic attacks  Obsessions:  No Compulsions:  No  Stressors:  Body image, Finances, Peer relationships, School performance, Sexual orientation, Sexuality, and Surgery/procedure  Alcohol and/or Substance Use: Have you recently consumed alcohol? no  Have you recently used any drugs?  no  Have you recently consumed any tobacco? yes Does patient seem concerned about dependence or abuse of any substance? yes, tobacco  Substance Use Disorder Checklist:  Craving, or a strong desire or urge to use the substance  Severity Risk Scoring based on DSM-5 Criteria for Substance Use Disorder. The presence of at least two (2) criteria in the last 12 months indicate a substance use disorder. The severity of the substance use disorder is defined as:  Mild: Presence of 2-3 criteria Moderate: Presence of 4-5 criteria Severe: Presence of 6 or more criteria  Traumatic Experiences: History or current traumatic events (natural disaster, house fire, etc.)? no History or current physical trauma?  no History or current emotional trauma?  yes History or current sexual trauma?  yes History or current domestic or intimate partner violence?  no History of bullying:  yes  Risk Assessment: Suicidal or homicidal thoughts?  no Self injurious behaviors?  Yes, Jan. 2024, Once poking self with comb, didn't happen before and hasn't happened since.  Guns in the home?  yes, locked up.   Self Harm Risk Factors:  Jan. 2024, Once poking self with comb, didn't happen before and hasn't happened since.   Self Harm Thoughts?: No  Patient and/or Family's Strengths/Protective Factors: Social and Emotional competence, Concrete supports in place (healthy food, safe environments, etc.), and patient resilience.  Patient's and/or Family's Goals in their own words: Wants to keep up doing affirmations, working on feelings, mindfulness five senses, cognitive triangle, CBT    Interventions: Interventions utilized:  Motivational Interviewing, Solution-Focused Strategies, Mindfulness or Psychologist, educational, Supportive Counseling, Psychoeducation and/or Health Education, and Supportive Reflection   Patient and/or Family Response: ***  Standardized Assessments completed: PHQ-SADS     08/16/2022   11:58 AM 06/14/2022    9:21 AM  PHQ-SADS Last 3 Score only  PHQ-15 Score 9 15  Total GAD-7 Score 15 16  PHQ Adolescent Score 13 19      Patient Centered Plan: Patient is on the following Treatment Plan(s):  ***  Coordination of Care: Treatment planning processes ***  DSM-5 Diagnosis: Generalized anxiety disorder Current moderate episode of major depressive disorder   Recommendations for Services/Supports/Treatments: ***  Progress towards Goals: Ongoing -patient making progress but still working towards goals related to anxiety and depression symptoms, ultimately helping patient improve overall health and well being.  Treatment Plan Summary: Behavioral Health Clinician will: Assess individual's status and evaluate for psychiatric symptoms, Provide coping skills enhancement, Utilize evidence based practices to address psychiatric symptoms, and Provide therapeutic counseling and medication monitoring  Individual will: Complete all homework and actively participate during therapy, Report all reactions/side effects, concerns about medications to prescribing doctor provider, Take all medications as prescribed,  Report any thoughts or plans of harming themselves or others, and Utilize coping skills taught in therapy to reduce symptoms  Referral(s): [place] (In Clinic)  Valda Favia, [place]

## 2022-08-30 ENCOUNTER — Ambulatory Visit (INDEPENDENT_AMBULATORY_CARE_PROVIDER_SITE_OTHER): Payer: 59 | Admitting: Licensed Clinical Social Worker

## 2022-08-30 DIAGNOSIS — F411 Generalized anxiety disorder: Secondary | ICD-10-CM

## 2022-08-30 DIAGNOSIS — F321 Major depressive disorder, single episode, moderate: Secondary | ICD-10-CM

## 2022-08-30 NOTE — BH Specialist Note (Signed)
Integrated Behavioral Health Follow Up In-Person Visit  MRN: [id number] Name: Sharil Laborin  Number of [place] Clinician visits: Seventh visit  Session Start time: 11:41 am Session End time: 12:20 pm Total time in minutes: 39 min  Types of Service: Individual psychotherapy  Interpretor:No.   Subjective: Sabri Bridwell is a 22 y.o. female accompanied by  self  Patient was referred by Hermenia Bers, NP for expressed life stressors associated with school and work.   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 -Patient reports she plans to continue the program until it finishes. -Patient reports the program continues to be a stressor. -Patient reports doing well this week.   Duration of initial problem: Patient reports noticing symptoms in the past year. Patient reports experiencing moments of sadness since she was in third grade; Severity of problem: moderate  Objective: Mood: Euthymic and Affect: Appropriate Risk of harm to self or others: No plan to harm self or others  Patient and/or Family's Strengths/Protective Factors: Social and Emotional competence, Concrete supports in place (healthy food, safe environments, etc.), and patient resilience.  Goals Addressed: Patient will:  Reduce symptoms of: anxiety and depression   Increase knowledge and/or ability of:  continue providing psychoeducation about depression and anxiety symptom impacts on daily living and continue providing patient with coping skills she can practice and use on a regular basis.     Demonstrate ability to: Increase healthy adjustment to current life circumstances and notice thoughts that impact feelings and behaviors, identify coping and relaxation skills.  Progress towards Goals: Ongoing- patient has continues to make  significant progress.  Interventions: Interventions utilized:  Motivational Interviewing, CBT Cognitive Behavioral Therapy, Supportive Counseling, Psychoeducation and/or Health Education, and Supportive Reflection Standardized Assessments completed: Not Needed  Developed and finalized patients treatment plan. Patient now has a target date of 11/01/2022 for her current treatment plan. Will continue to evaluate. Used psychoeducation about cognitive distortions. Engaged patient in processing cognitive distortions. Provided patient space to provide examples.    Patient and/or Family Response: Patient helped develop treatment plan and agreed to goals. Patient open and responsive to psychoeducation about cognitive distortions. Patient reports relating the most to negative labeling, reporting she does that a lot.   Patient Centered Plan: Patient is on the following Treatment Plan(s): Patient will continue paying attention to her anxious and negative thoughts. Patient will begin intentionally reframing the negative thought by analyzing if the thought is true and using a truth to combat the thought. Patient will write affirmations on a sticky note to put somewhere she frequently goes to [employer], car, etc).   Plan: Follow up with behavioral health clinician on : in two weeks Behavioral recommendations: Patient will identify what type of cognitive disoriented thinking she is engaging in between now and next session.  Referral(s): [place] (In Clinic) "From scale of 1-10, how likely are you to follow plan?": likely  Valda Favia, LCSW

## 2022-09-13 ENCOUNTER — Encounter (INDEPENDENT_AMBULATORY_CARE_PROVIDER_SITE_OTHER): Payer: Self-pay | Admitting: Family

## 2022-09-13 ENCOUNTER — Ambulatory Visit (INDEPENDENT_AMBULATORY_CARE_PROVIDER_SITE_OTHER): Payer: 59 | Admitting: Family

## 2022-09-13 ENCOUNTER — Ambulatory Visit (INDEPENDENT_AMBULATORY_CARE_PROVIDER_SITE_OTHER): Payer: 59 | Admitting: Licensed Clinical Social Worker

## 2022-09-13 VITALS — BP 124/80 | HR 76 | Wt 135.4 lb

## 2022-09-13 DIAGNOSIS — E1065 Type 1 diabetes mellitus with hyperglycemia: Secondary | ICD-10-CM | POA: Diagnosis not present

## 2022-09-13 DIAGNOSIS — F321 Major depressive disorder, single episode, moderate: Secondary | ICD-10-CM

## 2022-09-13 DIAGNOSIS — F411 Generalized anxiety disorder: Secondary | ICD-10-CM | POA: Diagnosis not present

## 2022-09-13 DIAGNOSIS — E559 Vitamin D deficiency, unspecified: Secondary | ICD-10-CM | POA: Diagnosis not present

## 2022-09-13 DIAGNOSIS — F4320 Adjustment disorder, unspecified: Secondary | ICD-10-CM | POA: Diagnosis not present

## 2022-09-13 DIAGNOSIS — Z4681 Encounter for fitting and adjustment of insulin pump: Secondary | ICD-10-CM

## 2022-09-13 LAB — POCT GLYCOSYLATED HEMOGLOBIN (HGB A1C): Hemoglobin A1C: 7.3 % — AB (ref 4.0–5.6)

## 2022-09-13 LAB — POCT GLUCOSE (DEVICE FOR HOME USE): Glucose Fasting, POC: 148 mg/dL — AB (ref 70–99)

## 2022-09-13 NOTE — BH Specialist Note (Unsigned)
Integrated Behavioral Health Follow Up In-Person Visit  MRN: [id number] Name: Shanquetta Ormond  Number of [facility] Clinician visits:Eighth visit  Session Start time:  Session End time:  Total time in minutes:   Types of Service: Individual psychotherapy  Interpretor:{yes [id number]}   Subjective: Shaliya Tribett is a 22 y.o. female accompanied by {Patient accompanied [phone]}  Patient was referred by *** for ***.   Subjective: *** Duration of initial  problem: ***; Severity of problem: {Mild/Moderate/Severe:20260}  Objective: Mood: {BHH MOOD:22306} and Affect: {BHH AFFECT:22307} Risk of harm to self or others: {CHL AMB BH Suicide Current Mental Status:21022748}  Patient and/or Family's Strengths/Protective Factors: {CHL AMB BH PROTECTIVE FACTORS:[phone]}  Goals Addressed: Patient will:  Reduce symptoms of: {IBH Symptoms:21014056}   Increase knowledge and/or ability of: {IBH Patient Tools:21014057}   Demonstrate ability to: {IBH Goals:21014053}  Progress towards Goals: {CHL AMB BH PROGRESS TOWARDS [phone]}  Interventions: Interventions utilized:  {IBH Interventions:21014054} Standardized Assessments completed: {IBH Screening Tools:21014051}  Patient and/or Family Response: ***  Patient Centered Plan: Patient is on the following Treatment Plan(s): ***  COGNITIVE DISOTATIONS LIST, AND DIALING BACK GOALS TO SMALLER.   Plan: Follow up with behavioral health clinician on : 2 weeks Behavioral recommendations: *** Referral(s): {IBH Referrals:21014055} "From scale of 1-10, how likely are you to follow plan?": ***  Jill Side, LCSW

## 2022-09-13 NOTE — Patient Instructions (Signed)
12AM 0.73  6am 1pm 0.70--> 0.80  0.80 --> 0.90   5pm 0.80 --> 0.85   10pm 0.8--> 0.85      19.35 units per day   It was a pleasure seeing you in clinic today. Please do not hesitate to contact me if you have questions or concerns.   Please sign up for MyChart. This is a communication tool that allows you to send an email directly to me. This can be used for questions, prescriptions and blood sugar reports. We will also release labs to you with instructions on MyChart. Please do not use MyChart if you need immediate or emergency assistance. Ask our wonderful front office staff if you need assistance.

## 2022-09-13 NOTE — Progress Notes (Signed)
Pediatric Endocrinology Diabetes Consultation Follow-up Visit  [name] Dec 12, 2000 [id number]  Chief Complaint: Follow-up type 1 diabetes   Patient, No Pcp Per   HPI: [name]  is a 22 y.o. female presenting for follow-up of type 1 diabetes. she is accompanied to this visit by her mother and father.  1. [name] presented to Urgent care on 03/19/15 after having vomiting. She had a 2-3 week history of polyuria and polydipsia. She was also more tired for weeks prior to her admission and had been losing weight. She was transferred to [facility] with a glucose of >600 and pH of 6.895. She was treated with the 2 bag method. She has no other family members with type 1 diabetes. Mom has thyroid disease. She was discharged on 03/23/15 on Lantus and Novolog. Her pancreatic islet cell antibodies were positive.   2. Since last visit to [facility] on 05/2021 , she has been well.  No ER visits or hospitalizations.  She has been busy with school, will be done in about 8 weeks. She has started doing crafts to help with anxiety and depression, she feels it has been helpful. She has been meeting with Shanda Bumps for behavioral health which is also helping.   She states that diabetes care has been better since the last time she was here. She thinks her time in target range has increased. Using Tandem tslim insulin pump and Dexcom G6 sensor. She boluses before eating. Estimates carb intake is 45-60 grams per meal. Hypoglycemia is rare, she is able to feel signs of low blood sugars when she is under 80.    Taking 2000 units of Vitamin D3 daily.    Insulin regimen: 17 units of Basaglar. Humalog  120/40/8 plan.. TSLIM insulin pump 12AM 0.73  6am 1pm 0.70 0.80   5pm 0.80   10pm 0.8     17.9 units per day   Insulin to Carbohydrate Ratio 12AM 10  6am 1pm 9 9   5pm 8.5   10pm 10        Insulin Sensitivity Factor 12AM 40  6am 45   5pm 30  10pm 40       Target Blood Glucose 12AM 120                  Hypoglycemia:Able to feel low blood sugars.  No glucagon needed recently.  Blood glucose download:  Dexcom CGM Download   -  Report shows pattern of hyperglycemia following bedtime snack from 11pm-4am.   Med-alert ID: Not currently wearing. Injection sites: arms and abdomen.  Annual labs due: 10/2022 Ophthalmology due: 2023, discussed importance today.      3. ROS: Greater than 10 systems reviewed with pertinent positives listed in HPI, otherwise neg. Constitutional: Sleeping well.  Eyes: No changes in vision. No blurry vision.  Ears/Nose/Mouth/Throat: No difficulty swallowing. Cardiovascular: No palpitations. No chest pain  Respiratory: No increased work of breathing Gastrointestinal: No constipation or diarrhea. No abdominal pain Genitourinary: No nocturia, no polyuria Neurologic: Normal sensation, no tremor Endocrine: No polydipsia.  No hyperpigmentation Psychiatric: Normal affect. Denies anxiety and depression.   Past Medical History:   Past Medical History:  Diagnosis Date   Diabetes mellitus without complication (HCC)    Eczema     Medications:  Outpatient Encounter Medications as of 09/13/2022  Medication Sig Note   acetone, urine, test strip 1 strip by Does not apply route as needed for high blood sugar. (Patient not taking: Reported on 05/04/2021) 12/31/2019: PRN   BD PEN  NEEDLE NANO 2ND GEN 32G X 4 MM MISC USE TO INJECT INSULIN AS DIRECTED. (Patient not taking: Reported on 05/04/2021) 12/31/2019: PRN pump failure   Blood Glucose Monitoring Suppl (ONETOUCH VERIO FLEX SYSTEM) w/Device KIT Use to check glucose (Patient not taking: Reported on 05/04/2021)    diclofenac (VOLTAREN) 75 MG EC tablet Take 1 tablet (75 mg total) by mouth 2 (two) times daily. (Patient not taking: Reported on 05/04/2021)    Glucagon (GVOKE HYPOPEN 2-PACK) 1 MG/0.2ML SOAJ Inject 1 Dose into the skin as needed. (Patient not taking: Reported on 08/10/2021)    insulin glargine (SEMGLEE, YFGN,) 100  UNIT/ML Solostar Pen INJECT UP TO 50 UNITS SUBCUTANEOUSLY DAILY (Patient not taking: Reported on 02/22/2022)    Insulin Human (INSULIN PUMP) SOLN Inject into the skin. (Patient not taking: Reported on 05/04/2021)    NOVOLOG 100 UNIT/ML injection INJECT UP TO 300 UNITS INTO PUMP EVERY 48 HOURS    NOVOLOG FLEXPEN 100 UNIT/ML FlexPen USE UP TO 50 UNITS DAILY (Patient not taking: Reported on 06/07/2022)    ONE TOUCH LANCETS MISC Use to check blood sugar up to six times a day. (Patient not taking: Reported on 05/04/2021)    ONETOUCH VERIO test strip CHECK BLOOD SUGAR 6 TIMES DAILY. (Patient not taking: Reported on 11/23/2021)    [DISCONTINUED] cetirizine (ZYRTEC) 10 MG tablet Take 10 mg by mouth daily as needed for allergies. Reported on 07/22/2015 (Patient not taking: Reported on 12/31/2019)    No facility-administered encounter medications on file as of 09/13/2022.    Allergies: Allergies  Allergen Reactions   Apple Fiber Anaphylaxis    Swelling of face and throat when eats apple peelings   Cherry     Throat and lip swelling     Surgical History: Past Surgical History:  Procedure Laterality Date   WISDOM TOOTH EXTRACTION      Family History:  Family History  Problem Relation Age of Onset   Graves' disease Mother    Hypertension Mother    Hypertension Father       Social History: Lives with: Mother and father  Starting Cosmetology school  Physical Exam:  There were no vitals filed for this visit.      There were no vitals taken for this visit. Body mass index: body mass index is unknown because there is no height or weight on file. Growth %ile SmartLinks can only be used for patients less than 60 years old.  Ht Readings from Last 3 Encounters:  10/06/20 4' 10.43" (1.484 m)  07/30/19 4' 10.74" (1.492 m) (1 %, Z= -2.17)*  03/12/19 4' 10.54" (1.487 m) (1 %, Z= -2.25)*   * Growth percentiles are based on CDC (Girls, 2-20 Years) data.   Wt Readings from Last 3 Encounters:   06/07/22 133 lb 9.6 oz (60.6 kg)  02/22/22 129 lb 6.4 oz (58.7 kg)  11/23/21 129 lb 3.2 oz (58.6 kg)    Physical Exam.  General: Well developed, well nourished female in no acute distress.  Head: Normocephalic, atraumatic.   Eyes:  Pupils equal and round. EOMI.  Sclera white.  No eye drainage.   Ears/Nose/Mouth/Throat: Nares patent, no nasal drainage.  Normal dentition, mucous membranes moist.  Neck: supple, no cervical lymphadenopathy, no thyromegaly Cardiovascular: regular rate, normal S1/S2, no murmurs Respiratory: No increased work of breathing.  Lungs clear to auscultation bilaterally.  No wheezes. Abdomen: soft, nontender, nondistended. Normal bowel sounds.  No appreciable masses  Extremities: warm, well perfused, cap refill < 2 sec.  Musculoskeletal: Normal muscle mass.  Normal strength Skin: warm, dry.  No rash or lesions. Neurologic: alert and oriented, normal speech, no tremor   Labs:  Lab Results  Component Value Date   HGBA1C 7.3 (A) 06/07/2022   Results for orders placed or performed in visit on 06/07/22  VITAMIN D 25 Hydroxy (Vit-D Deficiency, Fractures)  Result Value Ref Range   Vit D, 25-Hydroxy 34 30 - 100 ng/mL  POCT glycosylated hemoglobin (Hb A1C)  Result Value Ref Range   Hemoglobin A1C 7.3 (A) 4.0 - 5.6 %   HbA1c POC (<> result, manual entry)     HbA1c, POC (prediabetic range)     HbA1c, POC (controlled diabetic range)    POCT Glucose (Device for Home Use)  Result Value Ref Range   Glucose Fasting, POC 140 (A) 70 - 99 mg/dL   POC Glucose      Assessment/Plan: [name] is a 22 y.o. female with type 1 diabetes  on TSlim insulin pump and Dexcom CGM. [name] has made improvements with diabetes care and reports improved anxiety/depression. Her CGM download shows that she needs increased basal rate between 8am-12am. Hemoglobin A1c is 7.3% which is slightly higher then ADA goal of <7%. Her time in target range is 64%.     1-2. DM w/o complication type  I, uncontrolled (HCC)/HyperglycemiaI - Reviewed insulin pump and CGM download. Discussed trends and patterns.  - Rotate pump sites to prevent scar tissue.  - bolus 15 minutes prior to eating to limit blood sugar spikes.  - Reviewed carb counting and importance of accurate carb counting.  - Discussed signs and symptoms of hypoglycemia. Always have glucose available.  - POCT glucose and hemoglobin A1c  - Reviewed growth chart.  - Discussed update to Tandem and Dexcom G7  - Annual labs at next visit.   3. Hypovitaminosis D  - 2000 units of vitamin D per day   4 Insulin pump titration  12AM 0.73  6am 1pm 0.70--> 0.80  0.80 --> 0.90   5pm 0.80 --> 0.85   10pm 0.8--> 0.85      19.35 units per day  - Lantus 17 units  - Novolog 120/40/8   5, Adjustment reaction  - Continue Follow up with behavioral health--> jessica. Praise given for improvements.    Follow-up:   3 months.     LOS: >40  spent today reviewing the medical chart, counseling the patient/family, and documenting today's visit.  When a patient is on insulin, intensive monitoring of blood glucose levels is necessary to avoid hyperglycemia and hypoglycemia. Severe hyperglycemia/hypoglycemia can lead to hospital admissions and be life threatening.     Gretchen Short,  FNP-C  Pediatric Specialist  [address]  [place], [address]  Tele: [phone]

## 2022-09-27 ENCOUNTER — Ambulatory Visit (INDEPENDENT_AMBULATORY_CARE_PROVIDER_SITE_OTHER): Payer: Self-pay | Admitting: Licensed Clinical Social Worker

## 2022-09-27 NOTE — BH Specialist Note (Deleted)
Integrated Behavioral Health Follow Up In-Person Visit  MRN: [id number] Name: [name]  Number of Integrated Behavioral Health Clinician visits: Ninth Visit   Session Start time:  Session End time:  Total time in minutes:   Types of Service: {CHL AMB TYPE OF [phone]}  Interpretor:{yes NW:[id number]}   Subjective: [name] is a 22 y.o. female accompanied by {Patient accompanied by:[phone]}  Patient was referred by Gretchen Short, NP for expressed life stressors associated with school and work.   Subjective: ***   Duration of initial problem: Patient reports noticing symtomss in the past year. Patient reports expereincing moment of sadness since she was in third grade; Severity of problem: moderate  Objective: Mood: {BHH MOOD:22306} and Affect: {BHH AFFECT:22307} Risk of harm to self or others: {CHL AMB BH Suicide Current Mental Status:21022748}  Patient and/or Family's Strengths/Protective Factors: {CHL AMB BH PROTECTIVE FACTORS:[phone]}  Goals Addressed: Patient will:  Reduce symptoms of: anxiety and depression   Increase knowledge and/or ability of:  continue providing patient with coping skills she can practice and use on a regular basis.    Demonstrate ability to: Increase healthy adjustment to current life circumstances and notice thoughts that impact feelings and behaviors, identify coping and relaxation skills.   Progress towards Goals: {CHL AMB BH PROGRESS TOWARDS GOALS:[id number]}  Interventions: Interventions utilized:  {IBH Interventions:21014054} Standardized Assessments completed: {IBH Screening Tools:21014051}  Patient and/or Family Response: ***  Patient Centered Plan: Patient is on the following Treatment Plan(s): ***   Plan: Follow up with behavioral health clinician on : *** Behavioral recommendations: *** Referral(s): {IBH Referrals:21014055} "From scale of 1-10, how likely are you to follow plan?": ***  Jill Side, LCSW

## 2022-10-01 ENCOUNTER — Other Ambulatory Visit (INDEPENDENT_AMBULATORY_CARE_PROVIDER_SITE_OTHER): Payer: Self-pay | Admitting: Family

## 2022-10-01 DIAGNOSIS — E109 Type 1 diabetes mellitus without complications: Secondary | ICD-10-CM

## 2022-10-18 ENCOUNTER — Ambulatory Visit (INDEPENDENT_AMBULATORY_CARE_PROVIDER_SITE_OTHER): Payer: 59 | Admitting: Licensed Clinical Social Worker

## 2022-10-18 DIAGNOSIS — F411 Generalized anxiety disorder: Secondary | ICD-10-CM | POA: Diagnosis not present

## 2022-10-18 NOTE — BH Specialist Note (Unsigned)
Integrated Behavioral Health Follow Up In-Person Visit  MRN: [id number] Name: [name]  Number of Integrated Behavioral Health Clinician visits: Ninth visit   Session Start time: 1155 Session End time: 1220 Total time in minutes: 25 minutes  Types of Service: Individual psychotherapy  Interpretor:No.   Subjective: [name] is a 22 y.o. female accompanied by  self  Patient was referred by Dr. Gretchen Short for expressed life stressors associated with school and work.    Subjective:   -Patient reports she has been doing well and feeling like she is managing things better. -Patient reports she has been working out more for her health and has been feeling good about it.   Duration of initial problem: Patient reports noticing symptoms in the past year. Patient reports experiencing moments of sadness since she was in third grade; Severity of problem: moderate   Objective: Mood: Euthymic and Affect: Appropriate Risk of harm to self or others: No plan to harm self or others  Patient and/or Family's Strengths/Protective Factors: Social and Emotional competence, Concrete supports in place (healthy food, safe environments, etc.), and Parental Resilience  Goals Addressed: Patient will:  Reduce symptoms of: anxiety and depression   Increase knowledge and/or ability of: healthy habits and stress reduction   Demonstrate ability to: Increase healthy adjustment to current life circumstances  Progress towards Goals: Ongoing- Patient has been making significant progress. Will evaluate need of continue services.  Interventions: Interventions utilized:  Motivational Interviewing, Supportive Counseling, Psychoeducation and/or Health Education, and Supportive Reflection Standardized Assessments completed: Not Needed  Patient and/or Family Response: Patient open and responsive to clinician assessment and recommendation. Patient reports she would like to follow up in two weeks due  to anticipated school testing and the impacts it could have on her.  Patient Centered Plan: Patient is on the following Treatment Plan(s): Patient will continue paying attention to her anxious and negative thoughts. Patient will continue intentionally reframing the negative thought by analyzing if the thought is true and using truth to combat the thought. Patient will begin thinking about ways to create small goals to manage feelings of being overwhelmed as she finished her program.    Plan: Follow up with behavioral health clinician on : in two weeks Behavioral recommendations: see goals addressed and patient centered plan Referral(s): Integrated [employer] (In Clinic) "From scale of 1-10, how likely are you to follow plan?": likely  Vanessa Side, LCSW

## 2022-11-01 ENCOUNTER — Ambulatory Visit (INDEPENDENT_AMBULATORY_CARE_PROVIDER_SITE_OTHER): Payer: Self-pay | Admitting: Licensed Clinical Social Worker

## 2022-11-01 NOTE — BH Specialist Note (Deleted)
Integrated Behavioral Health Follow Up In-Person Visit  MRN: [id number] Name: [name]  Number of Integrated Behavioral Health Clinician visits: Tenth visit   Session Start time:  Session End time:  Total time in minutes:   Types of Service: Individual psychotherapy  Interpretor:No.   Subjective: [name] is a 22 y.o. female accompanied by  self.  Patient was referred by Gretchen Short, DNP for expressed life stressors associated with school and work    Subjective: ***   Duration of initial problem: Patient reports noticing symptoms in the past year. Patient reports experiencing moments of sadness since she was in third grade; Severity of problem: moderate   Objective: Mood: {BHH MOOD:22306} and Affect: {BHH AFFECT:22307} Risk of harm to self or others: {CHL AMB BH Suicide Current Mental Status:21022748}  Patient and/or Family's Strengths/Protective Factors: {CHL AMB BH PROTECTIVE FACTORS:[phone]}  Goals Addressed: Patient will:  Reduce symptoms of: anxiety and depression   Increase knowledge and/or ability of: healthy habits and stress reduction   Demonstrate ability to: Increase healthy adjustment to current life circumstance  Progress towards Goals: Ongoing- Patient has been making significant progress. Will valuate need of continues services.   Interventions: Interventions utilized:  {IBH Interventions:21014054} Standardized Assessments completed: {IBH Screening Tools:21014051}  Patient and/or Family Response: ***  Patient Centered Plan: Patient is on the following Treatment Plan(s): ***   Plan: Follow up with behavioral health clinician on : *** Behavioral recommendations: *** Referral(s): {IBH Referrals:21014055} "From scale of 1-10, how likely are you to follow plan?": ***  Jill Side, LCSW

## 2022-11-03 ENCOUNTER — Other Ambulatory Visit (INDEPENDENT_AMBULATORY_CARE_PROVIDER_SITE_OTHER): Payer: Self-pay | Admitting: Family

## 2022-11-03 DIAGNOSIS — E109 Type 1 diabetes mellitus without complications: Secondary | ICD-10-CM

## 2022-11-09 ENCOUNTER — Ambulatory Visit (INDEPENDENT_AMBULATORY_CARE_PROVIDER_SITE_OTHER): Payer: 59 | Admitting: Licensed Clinical Social Worker

## 2022-11-09 DIAGNOSIS — F411 Generalized anxiety disorder: Secondary | ICD-10-CM

## 2022-11-09 DIAGNOSIS — F331 Major depressive disorder, recurrent, moderate: Secondary | ICD-10-CM | POA: Diagnosis not present

## 2022-11-09 NOTE — BH Specialist Note (Signed)
Integrated Behavioral Health Follow Up In-Person Visit  MRN: [id number] Name: [name]  Number of Integrated Behavioral Health Clinician visits: tenth visit  Session Start time: 1340 Session End time: 1435 Total time in minutes:55 minutes   Types of Service: Individual psychotherapy  Interpretor:No.   Subjective: [name] is a 22 y.o. female accompanied by  self.   Patient was referred by Dr. Gretchen Short for expressed life stressors associated with school and work.   Subjective:  -Patient reports she has regressed further reporting experiencing more depression symptoms recently. -Patient relates the cycle to school and finishing school work. Patient reports she wants to stop going to school but feels pressured by family and others to finish. -Patient reports wanting to stop and work to "find herself" and be happy again.  Duration of initial problem: Patient reports noticing symptoms in the past year. Patient reports experiencing moments of sadness since she was in third grade; Severity of problem: moderate   Objective: Mood: Euthymic and Affect: Appropriate and Tearful Risk of harm to self or others: No plan to harm self or others  Patient and/or Family's Strengths/Protective Factors: Social and Emotional competence, Concrete supports in place (healthy food, safe environments, etc.), and patient resilience   Goals Addressed: Patient will:  Reduce symptoms of: anxiety and depression   Increase knowledge and/or ability of: healthy habits and stress reduction   Demonstrate ability to: Increase healthy adjustment to current life circumstances  Progress towards Goals: Ongoing  Interventions: Interventions utilized:  Motivational Interviewing, Supportive Counseling, Psychoeducation and/or Health Education, and Supportive Reflection Standardized Assessments completed: Not Needed  Explored thoughts and feelings. Used psychoeducation about right brain and left  brain. Emotions vs. Logic when making a decision. Processed with patient making a pros and cons list. Updated treatment plan and explored longer term therapy options for patient.   Patient and/or Family Response: Patient receptive to clinician assessment and recommendation. Patient open to doing a pros and cons list before making any decisions.   Patient Centered Plan: Patient is on the following Treatment Plan(s): Patient will continue paying attention to her anxious and negative thoughts. Patient will continue intentionally reframing the negative thoughts by analyzing if the thought is true and using truth to combat the thought. Patient will begin thinking about ways to create small goals to manage feelings of being overwhelmed as she finished her program. Clinician will make a referral for longer term therapy.   Plan: Follow up with behavioral health clinician on : November 30, 2022 Behavioral recommendations: pros and cons list, longer term therapy Referral(s): Integrated [facility] [employer] (In Clinic) and [facility] [facility] (LME/Outside Clinic) "From scale of 1-10, how likely are you to follow plan?": likely  Jill Side, LCSW

## 2022-11-10 NOTE — Addendum Note (Signed)
Addended by: Jill Side on: 11/10/2022 01:51 PM   Modules accepted: Orders

## 2022-11-15 ENCOUNTER — Ambulatory Visit (INDEPENDENT_AMBULATORY_CARE_PROVIDER_SITE_OTHER): Payer: 59 | Admitting: Licensed Clinical Social Worker

## 2022-11-29 ENCOUNTER — Encounter (INDEPENDENT_AMBULATORY_CARE_PROVIDER_SITE_OTHER): Payer: 59 | Admitting: Licensed Clinical Social Worker

## 2022-11-29 NOTE — BH Specialist Note (Deleted)
Integrated Behavioral Health via Telemedicine Visit  11/29/2022 Tristian Dust [id number]  Number of Integrated Behavioral Health Clinician visits: Additional Visit Eleventh visit  Session Start time:  Session End time:  Total time in minutes:   Referring Provider: Gretchen Short, DNP Patient/Family location: [facility] Provider location: *** All persons participating in visit: *** Types of Service: {CHL AMB TYPE OF SERVICE:[phone]}  I connected with Leonia Corona and/or Dartha Lodge {family members:20773} via  Telephone or Video Enabled Telemedicine Application  (Video is Caregility application) and verified that I am speaking with the correct person using two identifiers. Discussed confidentiality: {YES/NO:21197}  I discussed the limitations of telemedicine and the availability of in person appointments.  Discussed there is a possibility of technology failure and discussed alternative modes of communication if that failure occurs.  I discussed that engaging in this telemedicine visit, they consent to the provision of behavioral healthcare and the services will be billed under their insurance.  Patient and/or legal guardian expressed understanding and consented to Telemedicine visit: {YES/NO:21197}  Presenting Concerns: Patient and/or family reports the following symptoms/concerns: *** Duration of problem: ***; Severity of problem: {Mild/Moderate/Severe:20260}  Patient and/or Family's Strengths/Protective Factors: {CHL AMB BH PROTECTIVE FACTORS:[phone]}  Goals Addressed: Patient will:  Reduce symptoms of: {IBH Symptoms:21014056}   Increase knowledge and/or ability of: {IBH Patient Tools:21014057}   Demonstrate ability to: {IBH Goals:21014053}  Progress towards Goals: {CHL AMB BH PROGRESS TOWARDS [phone]}  Interventions: Interventions utilized:  {IBH Interventions:21014054} Standardized Assessments completed: {IBH Screening Tools:21014051}  Patient and/or  Family Response: ***  Assessment: Patient currently experiencing ***.   Patient may benefit from ***.  Plan: Follow up with behavioral health clinician on : *** Behavioral recommendations: *** Referral(s): {IBH Referrals:21014055}  I discussed the assessment and treatment plan with the patient and/or parent/guardian. They were provided an opportunity to ask questions and all were answered. They agreed with the plan and demonstrated an understanding of the instructions.   They were advised to call back or seek an in-person evaluation if the symptoms worsen or if the condition fails to improve as anticipated.  Jill Side, LCSW

## 2022-12-06 ENCOUNTER — Other Ambulatory Visit (INDEPENDENT_AMBULATORY_CARE_PROVIDER_SITE_OTHER): Payer: Self-pay | Admitting: Family

## 2022-12-06 DIAGNOSIS — E109 Type 1 diabetes mellitus without complications: Secondary | ICD-10-CM

## 2022-12-13 ENCOUNTER — Ambulatory Visit (INDEPENDENT_AMBULATORY_CARE_PROVIDER_SITE_OTHER): Payer: 59 | Admitting: Family

## 2022-12-13 NOTE — Progress Notes (Deleted)
Pediatric Endocrinology Diabetes Consultation Follow-up Visit  [name] [birth year] [id number]  Chief Complaint: Follow-up type 1 diabetes   Patient, No Pcp Per   HPI: [name]  is a 22 y.o. female presenting for follow-up of type 1 diabetes. she is accompanied to this visit by her mother and father.  1. [name] presented to Urgent care on 03/19/15 after having vomiting. She had a 2-3 week history of polyuria and polydipsia. She was also more tired for weeks prior to her admission and had been losing weight. She was transferred to [facility] with a glucose of >600 and pH of 6.895. She was treated with the 2 bag method. She has no other family members with type 1 diabetes. Mom has thyroid disease. She was discharged on 03/23/15 on Lantus and Novolog. Her pancreatic islet cell antibodies were positive.   2. Since last visit to [facility] on 08/2021 , she has been well.  No ER visits or hospitalizations.  She has been busy with school, will be done in about 8 weeks. She has started doing crafts to help with anxiety and depression, she feels it has been helpful. She has been meeting with Shanda Bumps for behavioral health which is also helping.   She states that diabetes care has been better since the last time she was here. She thinks her time in target range has increased. Using Tandem tslim insulin pump and Dexcom G6 sensor. She boluses before eating. Estimates carb intake is 45-60 grams per meal. Hypoglycemia is rare, she is able to feel signs of low blood sugars when she is under 80.    Taking 2000 units of Vitamin D3 daily.    Insulin regimen:  TSLIM insulin pump 12AM 0.73  6am 1pm 0.80  0.90   5pm 0.85   10pm 0.85      19.35 units per day  Insulin to Carbohydrate Ratio 12AM 10  6am 1pm 9 9   5pm 8.5   10pm 10        Insulin Sensitivity Factor 12AM 40  6am 45   5pm 30  10pm 40       Target Blood Glucose 12AM 120                 Hypoglycemia:Able to feel low blood sugars.   No glucagon needed recently.  Blood glucose download:  Dexcom CGM Download   -  Report shows pattern of hyperglycemia following bedtime snack from 11pm-4am.   Med-alert ID: Not currently wearing. Injection sites: arms and abdomen.  Annual labs due: 10/2022 Ophthalmology due: 2023, discussed importance today.      3. ROS: Greater than 10 systems reviewed with pertinent positives listed in HPI, otherwise neg. Constitutional: Sleeping well.  Eyes: No changes in vision. No blurry vision.  Ears/Nose/Mouth/Throat: No difficulty swallowing. Cardiovascular: No palpitations. No chest pain  Respiratory: No increased work of breathing Gastrointestinal: No constipation or diarrhea. No abdominal pain Genitourinary: No nocturia, no polyuria Neurologic: Normal sensation, no tremor Endocrine: No polydipsia.  No hyperpigmentation Psychiatric: Normal affect. Denies anxiety and depression.   Past Medical History:   Past Medical History:  Diagnosis Date   Diabetes mellitus without complication (HCC)    Eczema     Medications:  Outpatient Encounter Medications as of 12/13/2022  Medication Sig Note   acetone, urine, test strip 1 strip by Does not apply route as needed for high blood sugar. (Patient not taking: Reported on 05/04/2021) 12/31/2019: PRN   BD PEN NEEDLE NANO 2ND GEN 32G X  4 MM MISC USE TO INJECT INSULIN AS DIRECTED. (Patient not taking: Reported on 05/04/2021) 12/31/2019: PRN pump failure   Blood Glucose Monitoring Suppl (ONETOUCH VERIO FLEX SYSTEM) w/Device KIT Use to check glucose (Patient not taking: Reported on 05/04/2021)    diclofenac (VOLTAREN) 75 MG EC tablet Take 1 tablet (75 mg total) by mouth 2 (two) times daily. (Patient not taking: Reported on 05/04/2021)    Glucagon (GVOKE HYPOPEN 2-PACK) 1 MG/0.2ML SOAJ Inject 1 Dose into the skin as needed. (Patient not taking: Reported on 08/10/2021)    insulin aspart (NOVOLOG) 100 UNIT/ML injection INJECT UP TO 300 UNITS INTO PUMP EVERY 48 HOURS     insulin glargine (SEMGLEE, YFGN,) 100 UNIT/ML Solostar Pen INJECT UP TO 50 UNITS SUBCUTANEOUSLY DAILY (Patient not taking: Reported on 02/22/2022)    Insulin Human (INSULIN PUMP) SOLN Inject into the skin. (Patient not taking: Reported on 05/04/2021)    NOVOLOG FLEXPEN 100 UNIT/ML FlexPen USE UP TO 50 UNITS DAILY (Patient not taking: Reported on 06/07/2022)    ONE TOUCH LANCETS MISC Use to check blood sugar up to six times a day. (Patient not taking: Reported on 05/04/2021)    ONETOUCH VERIO test strip CHECK BLOOD SUGAR 6 TIMES DAILY. (Patient not taking: Reported on 11/23/2021)    [DISCONTINUED] cetirizine (ZYRTEC) 10 MG tablet Take 10 mg by mouth daily as needed for allergies. Reported on 07/22/2015 (Patient not taking: Reported on 12/31/2019)    No facility-administered encounter medications on file as of 12/13/2022.    Allergies: Allergies  Allergen Reactions   Apple Fiber Anaphylaxis    Swelling of face and throat when eats apple peelings   Apple Fruit Extract Other (See Comments)   Cherry     Throat and lip swelling     Surgical History: Past Surgical History:  Procedure Laterality Date   WISDOM TOOTH EXTRACTION      Family History:  Family History  Problem Relation Age of Onset   Graves' disease Mother    Hypertension Mother    Hypertension Father       Social History: Lives with: Mother and father  Starting Cosmetology school  Physical Exam:  There were no vitals filed for this visit.      There were no vitals taken for this visit. Body mass index: body mass index is unknown because there is no height or weight on file. Growth %ile SmartLinks can only be used for patients less than 17 years old.  Ht Readings from Last 3 Encounters:  10/06/20 4' 10.43" (1.484 m)  07/30/19 4' 10.74" (1.492 m) (1%, Z= -2.17)*  03/12/19 4' 10.54" (1.487 m) (1%, Z= -2.25)*   * Growth percentiles are based on CDC (Girls, 2-20 Years) data.   Wt Readings from Last 3 Encounters:   09/13/22 135 lb 6.4 oz (61.4 kg)  06/07/22 133 lb 9.6 oz (60.6 kg)  02/22/22 129 lb 6.4 oz (58.7 kg)    Physical Exam.  General: Well developed, well nourished female in no acute distress.   Head: Normocephalic, atraumatic.   Eyes:  Pupils equal and round. EOMI.   Sclera white.  No eye drainage.   Ears/Nose/Mouth/Throat: Nares patent, no nasal drainage.  Normal dentition, mucous membranes moist.   Neck: supple, no cervical lymphadenopathy, no thyromegaly Cardiovascular: regular rate, normal S1/S2, no murmurs Respiratory: No increased work of breathing.  Lungs clear to auscultation bilaterally.  No wheezes. Abdomen: soft, nontender, nondistended. No appreciable masses  Extremities: warm, well perfused, cap refill < 2 sec.  Musculoskeletal: Normal muscle mass.  Normal strength Skin: warm, dry.  No rash or lesions. Neurologic: alert and oriented, normal speech, no tremor   Labs:  Lab Results  Component Value Date   HGBA1C 7.3 (A) 09/13/2022   Results for orders placed or performed in visit on 09/13/22  POCT glycosylated hemoglobin (Hb A1C)  Result Value Ref Range   Hemoglobin A1C 7.3 (A) 4.0 - 5.6 %   HbA1c POC (<> result, manual entry)     HbA1c, POC (prediabetic range)     HbA1c, POC (controlled diabetic range)    POCT Glucose (Device for Home Use)  Result Value Ref Range   Glucose Fasting, POC 148 (A) 70 - 99 mg/dL   POC Glucose      Assessment/Plan: [name] is a 22 y.o. female with type 1 diabetes  on TSlim insulin pump and Dexcom CGM. [name] has made improvements with diabetes care and reports improved anxiety/depression. Her CGM download shows that she needs increased basal rate between 8am-12am. Hemoglobin A1c is 7.3% which is slightly higher then ADA goal of <7%. Her time in target range is 64%.     1-2. DM w/o complication type I, uncontrolled (HCC)/HyperglycemiaI - Reviewed insulin pump and CGM download. Discussed trends and patterns.  - Rotate pump sites to  prevent scar tissue.  - bolus 15 minutes prior to eating to limit blood sugar spikes.  - Reviewed carb counting and importance of accurate carb counting.  - Discussed signs and symptoms of hypoglycemia. Always have glucose available.  - POCT glucose and hemoglobin A1c  - Reviewed growth chart.    3. Hypovitaminosis D  - 2000 units of vitamin D per day   4 Insulin pump titration  12AM 0.73  6am 1pm 0.70--> 0.80  0.80 --> 0.90   5pm 0.80 --> 0.85   10pm 0.8--> 0.85      19.35 units per day  - Lantus 17 units  - Novolog 120/40/8   5, Adjustment reaction  - Continue Follow up with behavioral health--> jessica. Praise given for improvements.    Follow-up:   3 months.     LOS: >40  spent today reviewing the medical chart, counseling the patient/family, and documenting today's visit.  When a patient is on insulin, intensive monitoring of blood glucose levels is necessary to avoid hyperglycemia and hypoglycemia. Severe hyperglycemia/hypoglycemia can lead to hospital admissions and be life threatening.     Gretchen Short,  FNP-C  Pediatric Specialist  [address]  [place], [address]  Tele: [phone]

## 2022-12-27 ENCOUNTER — Encounter (INDEPENDENT_AMBULATORY_CARE_PROVIDER_SITE_OTHER): Payer: Self-pay

## 2023-01-29 ENCOUNTER — Encounter (HOSPITAL_COMMUNITY): Payer: Self-pay

## 2023-01-29 ENCOUNTER — Ambulatory Visit (HOSPITAL_COMMUNITY)
Admission: RE | Admit: 2023-01-29 | Discharge: 2023-01-29 | Discharge status: Home / Self Care | Payer: No Typology Code available for payment source | Source: Ambulatory Visit

## 2023-01-29 VITALS — BP 132/83 | HR 96 | Temp 97.7°F | Resp 14

## 2023-01-29 DIAGNOSIS — L309 Dermatitis, unspecified: Secondary | ICD-10-CM

## 2023-01-29 DIAGNOSIS — Z760 Encounter for issue of repeat prescription: Secondary | ICD-10-CM | POA: Diagnosis not present

## 2023-01-29 MED ORDER — TRIAMCINOLONE ACETONIDE 0.1 % EX CREA: 1.0000 | TOPICAL_CREAM | Freq: Two times a day (BID) | CUTANEOUS | 0 refills | Status: DC

## 2023-01-29 NOTE — ED Triage Notes (Signed)
Pt has eczema and needing refill of triamcinolone acetonide 0.1% to help.

## 2023-01-29 NOTE — Discharge Instructions (Signed)
The Kenalog cream to the areas that are most itchy.  Continue using moisturizer twice daily.  Recommend establishing care with PCP for health prevention/maintenance.

## 2023-01-29 NOTE — ED Provider Notes (Signed)
[facility]    CSN: [id number] Arrival date & time: 01/29/23  1052      History   Chief Complaint Chief Complaint  Patient presents with   appt 1100   Medication Refill    HPI [name] is a 22 y.o. female.   Patient presents today with 1 week history of eczema exacerbation.  Reports she recently changed laundry soap prior to going on a trip and thinks that is what caused the outbreak.  Reports that she is itchy around her neck and on her back.  Reports that she also had itchiness on her face which has improved.  No fevers or nausea/vomiting.  Reports history of eczema, usually takes triamcinolone cream 0.1% and it helps.  Reports that since this time, she is returned to her normal soap and has been using oat milk lotion with improvement.  Reports she moisturizes twice daily.    Past Medical History:  Diagnosis Date   Diabetes mellitus without complication (HCC)    Eczema     Patient Active Problem List   Diagnosis Date Noted   Hypovitaminosis D 09/13/2022   Insulin pump titration 04/07/2020   Hyperglycemia 09/01/2017   Insulin dose changed (HCC) 09/01/2017   Maladaptive health behaviors affecting medical condition 08/01/2017   Type 1 diabetes mellitus (HCC) 07/05/2016   Elevated blood pressure 04/17/2015   Adjustment reaction of adult life     Past Surgical History:  Procedure Laterality Date   WISDOM TOOTH EXTRACTION      OB History   No obstetric history on file.      Home Medications    Prior to Admission medications   Medication Sig Start Date End Date Taking? Authorizing Provider  acetone, urine, test strip 1 strip by Does not apply route as needed for high blood sugar. Patient not taking: Reported on 05/04/2021 03/22/15   Marquette Saa, MD  BD PEN NEEDLE NANO 2ND GEN 32G X 4 MM MISC USE TO INJECT INSULIN AS DIRECTED. Patient not taking: Reported on 05/04/2021 09/18/19   Gretchen Short, NP  Blood Glucose Monitoring Suppl  (ONETOUCH VERIO FLEX SYSTEM) w/Device KIT Use to check glucose Patient not taking: Reported on 05/04/2021 11/18/17   Gretchen Short, NP  diclofenac (VOLTAREN) 75 MG EC tablet Take 1 tablet (75 mg total) by mouth 2 (two) times daily. Patient not taking: Reported on 05/04/2021 02/13/20   Mardella Layman, MD  Glucagon (GVOKE HYPOPEN 2-PACK) 1 MG/0.2ML SOAJ Inject 1 Dose into the skin as needed. Patient not taking: Reported on 08/10/2021 05/04/21   Gretchen Short, NP  insulin aspart (NOVOLOG) 100 UNIT/ML injection INJECT UP TO 300 UNITS INTO PUMP EVERY 48 HOURS 12/06/22   Gretchen Short, NP  insulin glargine (SEMGLEE, YFGN,) 100 UNIT/ML Solostar Pen INJECT UP TO 50 UNITS SUBCUTANEOUSLY DAILY Patient not taking: Reported on 02/22/2022 05/19/21   Gretchen Short, NP  Insulin Human (INSULIN PUMP) SOLN Inject into the skin. Patient not taking: Reported on 05/04/2021    [provider]  NOVOLOG FLEXPEN 100 UNIT/ML FlexPen USE UP TO 50 UNITS DAILY Patient not taking: Reported on 06/07/2022 05/18/21   Gretchen Short, NP  ONE TOUCH LANCETS MISC Use to check blood sugar up to six times a day. Patient not taking: Reported on 05/04/2021 03/22/15   Marquette Saa, MD  [facility] VERIO test strip CHECK BLOOD SUGAR 6 TIMES DAILY. Patient not taking: Reported on 11/23/2021 08/17/21   Gretchen Short, NP  triamcinolone cream (KENALOG) 0.1 % Apply 1  Application topically 2 (two) times daily. 01/29/23   Valentino Nose, NP  cetirizine (ZYRTEC) 10 MG tablet Take 10 mg by mouth daily as needed for allergies. Reported on 07/22/2015 Patient not taking: Reported on 12/31/2019  02/13/20  [provider]    Family History Family History  Problem Relation Age of Onset   Graves' disease Mother    Hypertension Mother    Hypertension Father     Social History Social History   Tobacco Use   Smoking status: Never   Smokeless tobacco: Never  Vaping Use   Vaping status: Never Used  Substance Use  Topics   Alcohol use: No   Drug use: No     Allergies   Apple fiber, Apple fruit extract, and Cherry   Review of Systems Review of Systems Per HPI  Physical Exam Triage Vital Signs ED Triage Vitals [01/29/23 1111]  Encounter Vitals Group     BP 132/83     Systolic BP Percentile      Diastolic BP Percentile      Pulse Rate 96     Resp 14     Temp 97.7 F (36.5 C)     Temp Source Oral     SpO2 99 %     Weight      Height      Head Circumference      Peak Flow      Pain Score 0     Pain Loc      Pain Education      Exclude from Growth Chart    No data found.  Updated Vital Signs BP 132/83 (BP Location: Right Arm)   Pulse 96   Temp 97.7 F (36.5 C) (Oral)   Resp 14   LMP 01/25/2023   SpO2 99%   Visual Acuity Right Eye Distance:   Left Eye Distance:   Bilateral Distance:    Right Eye Near:   Left Eye Near:    Bilateral Near:     Physical Exam Vitals and nursing note reviewed.  Constitutional:      General: She is not in acute distress.    Appearance: Normal appearance. She is not toxic-appearing.  HENT:     Mouth/Throat:     Mouth: Mucous membranes are moist.     Pharynx: Oropharynx is clear.  Pulmonary:     Effort: Pulmonary effort is normal. No respiratory distress.  Skin:    General: Skin is warm and dry.     Capillary Refill: Capillary refill takes less than 2 seconds.     Findings: Rash present.     Comments: Raised, hyperpigmented rash to right neck consistent with atopic dermatitis.  There are excoriation marks to the upper back.  Neurological:     Mental Status: She is alert and oriented to person, place, and time.  Psychiatric:        Behavior: Behavior is cooperative.      UC Treatments / Results  Labs (all labs ordered are listed, but only abnormal results are displayed) Labs Reviewed - No data to display  EKG   Radiology No results found.  Procedures Procedures (including critical care time)  Medications Ordered in  UC Medications - No data to display  Initial Impression / Assessment and Plan / UC Course  I have reviewed the triage vital signs and the nursing notes.  Pertinent labs & imaging results that were available during my care of the patient were reviewed by me and considered  in my medical decision making (see chart for details).   Patient is well-appearing, normotensive, afebrile, not tachycardic, not tachypneic, oxygenating well on room air.    1. Medication refill 2. Eczema, unspecified type Resume triamcinolone cream Discussed eczema and good skin care Recommended establishing care with PCP for future refills/health prevention and maintenance  The patient was given the opportunity to ask questions.  All questions answered to their satisfaction.  The patient is in agreement to this plan.    Final Clinical Impressions(s) / UC Diagnoses   Final diagnoses:  Medication refill  Eczema, unspecified type     Discharge Instructions      The Kenalog cream to the areas that are most itchy.  Continue using moisturizer twice daily.  Recommend establishing care with PCP for health prevention/maintenance.    ED Prescriptions     Medication Sig Dispense Auth. Provider   triamcinolone cream (KENALOG) 0.1 % Apply 1 Application topically 2 (two) times daily. 30 g Valentino Nose, NP      PDMP not reviewed this encounter.   Valentino Nose, NP 01/29/23 1143

## 2023-03-14 ENCOUNTER — Ambulatory Visit (INDEPENDENT_AMBULATORY_CARE_PROVIDER_SITE_OTHER): Payer: No Typology Code available for payment source | Admitting: Family

## 2023-03-14 ENCOUNTER — Encounter (INDEPENDENT_AMBULATORY_CARE_PROVIDER_SITE_OTHER): Payer: Self-pay | Admitting: Family

## 2023-03-14 VITALS — BP 118/70 | HR 110 | Ht 58.5 in | Wt 132.0 lb

## 2023-03-14 DIAGNOSIS — E1065 Type 1 diabetes mellitus with hyperglycemia: Secondary | ICD-10-CM | POA: Diagnosis not present

## 2023-03-14 DIAGNOSIS — E559 Vitamin D deficiency, unspecified: Secondary | ICD-10-CM

## 2023-03-14 DIAGNOSIS — Z4681 Encounter for fitting and adjustment of insulin pump: Secondary | ICD-10-CM | POA: Diagnosis not present

## 2023-03-14 DIAGNOSIS — Z23 Encounter for immunization: Secondary | ICD-10-CM | POA: Diagnosis not present

## 2023-03-14 LAB — POCT GLYCOSYLATED HEMOGLOBIN (HGB A1C): Hemoglobin A1C: 7.1 % — AB (ref 4.0–5.6)

## 2023-03-14 LAB — POCT GLUCOSE (DEVICE FOR HOME USE): POC Glucose: 138 mg/dL — AB (ref 70–99)

## 2023-03-14 MED ORDER — INSULIN ASPART 100 UNIT/ML IJ SOLN: INTRAMUSCULAR | 5 refills | Status: DC

## 2023-03-14 NOTE — Patient Instructions (Addendum)
12AM 0.70--> 0.75  6am 1pm 0.80 --> 0.90  0.90 --> 1.0   5pm 0.85 --> 0.95  10pm 0.85 --> 0.95      21.45 units per day  Insulin to Carbohydrate Ratio 12AM 10  6am 1pm 9 9   5pm 7.5 --> 7   10pm 10 --> 9        Insulin Sensitivity Factor 12AM 40  6am 1pm  45  38  5pm 38   10pm 40       Target Blood Glucose 12AM 120

## 2023-03-14 NOTE — Progress Notes (Signed)
Pediatric Endocrinology Diabetes Consultation Follow-up Visit  Iolani Twilley Sep 07, 2000 [id number]  Chief Complaint: Follow-up type 1 diabetes   Patient, No Pcp Per   HPI: Noni  is a 22 y.o. female presenting for follow-up of type 1 diabetes. she is accompanied to this visit by her mother and father.  1. Jasman presented to Urgent care on 03/19/15 after having vomiting. She had a 2-3 week history of polyuria and polydipsia. She was also more tired for weeks prior to her admission and had been losing weight. She was transferred to [facility] with a glucose of >600 and pH of 6.895. She was treated with the 2 bag method. She has no other family members with type 1 diabetes. Mom has thyroid disease. She was discharged on 03/23/15 on Lantus and Novolog. Her pancreatic islet cell antibodies were positive.   2. Since last visit to [facility] on 08/2022 , she has been well.  No ER visits or hospitalizations.  She decided to stop school due to stress. She is currently looking for a job. Has not had much time for activity lately.   She feels like she is doing well with her diabetes care. Using Tandem Tslim insulin pump and Dexcom G6, both have been working well. She boluses before eating, rarely forgets to bolus. Estimates carb intake is close to 80 grams per meal. Hypoglycemia has not occurred often. No severe hypoglycemia requiring glucagon.   Taking 2000 units of Vitamin D3 daily.    Insulin regimen: Tandem TsLIM insulin pump 12AM 0.73  6am 1pm 0.80  0.90   5pm 0.85   10pm 0.85      19.35 units per day  Insulin to Carbohydrate Ratio 12AM 10  6am 1pm 9 9   5pm 8.5   10pm 10        Insulin Sensitivity Factor 12AM 40  6am 45   5pm 30  10pm 40       Target Blood Glucose 12AM 120                 Hypoglycemia:Able to feel low blood sugars.  No glucagon needed recently.  Blood glucose download:  Dexcom CGM Download    Med-alert ID: Not currently wearing. Injection sites: arms  and abdomen.  Annual labs due: 10/2022--> ordered  Ophthalmology due: 2023, discussed importance today.      3. ROS: Greater than 10 systems reviewed with pertinent positives listed in HPI, otherwise neg. Constitutional: Sleeping well.  Eyes: No changes in vision. No blurry vision.  Ears/Nose/Mouth/Throat: No difficulty swallowing. Cardiovascular: No palpitations. No chest pain  Respiratory: No increased work of breathing Gastrointestinal: No constipation or diarrhea. No abdominal pain Genitourinary: No nocturia, no polyuria Neurologic: Normal sensation, no tremor Endocrine: No polydipsia.  No hyperpigmentation Psychiatric: Normal affect. Denies anxiety and depression.   Past Medical History:   Past Medical History:  Diagnosis Date   Diabetes mellitus without complication (HCC)    Eczema     Medications:  Outpatient Encounter Medications as of 03/14/2023  Medication Sig Note   acetone, urine, test strip 1 strip by Does not apply route as needed for high blood sugar. (Patient not taking: Reported on 05/04/2021) 12/31/2019: PRN   BD PEN NEEDLE NANO 2ND GEN 32G X 4 MM MISC USE TO INJECT INSULIN AS DIRECTED. (Patient not taking: Reported on 05/04/2021) 12/31/2019: PRN pump failure   Blood Glucose Monitoring Suppl (ONETOUCH VERIO FLEX SYSTEM) w/Device KIT Use to check glucose (Patient not taking: Reported on 05/04/2021)  diclofenac (VOLTAREN) 75 MG EC tablet Take 1 tablet (75 mg total) by mouth 2 (two) times daily. (Patient not taking: Reported on 05/04/2021)    Glucagon (GVOKE HYPOPEN 2-PACK) 1 MG/0.2ML SOAJ Inject 1 Dose into the skin as needed. (Patient not taking: Reported on 08/10/2021)    insulin aspart (NOVOLOG) 100 UNIT/ML injection INJECT UP TO 300 UNITS INTO PUMP EVERY 48 HOURS    insulin glargine (SEMGLEE, YFGN,) 100 UNIT/ML Solostar Pen INJECT UP TO 50 UNITS SUBCUTANEOUSLY DAILY (Patient not taking: Reported on 02/22/2022)    Insulin Human (INSULIN PUMP) SOLN Inject into the skin.  (Patient not taking: Reported on 05/04/2021)    NOVOLOG FLEXPEN 100 UNIT/ML FlexPen USE UP TO 50 UNITS DAILY (Patient not taking: Reported on 06/07/2022)    ONE TOUCH LANCETS MISC Use to check blood sugar up to six times a day. (Patient not taking: Reported on 05/04/2021)    ONETOUCH VERIO test strip CHECK BLOOD SUGAR 6 TIMES DAILY. (Patient not taking: Reported on 11/23/2021)    triamcinolone cream (KENALOG) 0.1 % Apply 1 Application topically 2 (two) times daily.    [DISCONTINUED] cetirizine (ZYRTEC) 10 MG tablet Take 10 mg by mouth daily as needed for allergies. Reported on 07/22/2015 (Patient not taking: Reported on 12/31/2019)    No facility-administered encounter medications on file as of 03/14/2023.    Allergies: Allergies  Allergen Reactions   Apple Fiber Anaphylaxis    Swelling of face and throat when eats apple peelings   Apple Fruit Extract Other (See Comments)   Cherry     Throat and lip swelling     Surgical History: Past Surgical History:  Procedure Laterality Date   WISDOM TOOTH EXTRACTION      Family History:  Family History  Problem Relation Age of Onset   Graves' disease Mother    Hypertension Mother    Hypertension Father       Social History: Lives with: Mother and father    Physical Exam:  There were no vitals filed for this visit.  There were no vitals taken for this visit. Body mass index: body mass index is unknown because there is no height or weight on file. Growth %ile SmartLinks can only be used for patients less than 5 years old.  Ht Readings from Last 3 Encounters:  10/06/20 4' 10.43" (1.484 m)  07/30/19 4' 10.74" (1.492 m) (1%, Z= -2.17)*  03/12/19 4' 10.54" (1.487 m) (1%, Z= -2.25)*   * Growth percentiles are based on CDC (Girls, 2-20 Years) data.   Wt Readings from Last 3 Encounters:  09/13/22 135 lb 6.4 oz (61.4 kg)  06/07/22 133 lb 9.6 oz (60.6 kg)  02/22/22 129 lb 6.4 oz (58.7 kg)    Physical Exam.  General: Well developed, well  nourished female in no acute distress.   Head: Normocephalic, atraumatic.   Eyes:  Pupils equal and round. EOMI.   Sclera white.  No eye drainage.   Ears/Nose/Mouth/Throat: Nares patent, no nasal drainage.  Normal dentition, mucous membranes moist.   Neck: supple, no cervical lymphadenopathy, no thyromegaly Cardiovascular: regular rate, normal S1/S2, no murmurs Respiratory: No increased work of breathing.  Lungs clear to auscultation bilaterally.  No wheezes. Abdomen: soft, nontender, nondistended. No appreciable masses  Extremities: warm, well perfused, cap refill < 2 sec.   Musculoskeletal: Normal muscle mass.  Normal strength Skin: warm, dry.  No rash or lesions. Neurologic: alert and oriented, normal speech, no tremor    Labs:  Lab Results  Component  Value Date   HGBA1C 7.3 (A) 09/13/2022   Results for orders placed or performed in visit on 09/13/22  POCT glycosylated hemoglobin (Hb A1C)  Result Value Ref Range   Hemoglobin A1C 7.3 (A) 4.0 - 5.6 %   HbA1c POC (<> result, manual entry)     HbA1c, POC (prediabetic range)     HbA1c, POC (controlled diabetic range)    POCT Glucose (Device for Home Use)  Result Value Ref Range   Glucose Fasting, POC 148 (A) 70 - 99 mg/dL   POC Glucose      Assessment/Plan: Mariette is a 22 y.o. female with type 1 diabetes  on TSlim insulin pump and Dexcom CGM. She has a pattern of hyperglycemia between 10pm-12 am due to late night snacks. Hemoglobin A1c is 7.1% today, higher then ADA goal of <7%. Her time in target range is 65%.      1-2. DM w/o complication type I, uncontrolled (HCC)/HyperglycemiaI - Reviewed insulin pump and CGM download. Discussed trends and patterns.  - Rotate pump sites to prevent scar tissue.  - bolus 15 minutes prior to eating to limit blood sugar spikes.  - Reviewed carb counting and importance of accurate carb counting.  - Discussed signs and symptoms of hypoglycemia. Always have glucose available.  - POCT glucose  and hemoglobin A1c  - Reviewed growth chart.  - Refer to adult endocrinology Lab Orders         COMPLETE METABOLIC PANEL WITH GFR         Lipid panel         Microalbumin / creatinine urine ratio         T4, free         TSH         VITAMIN D 25 Hydroxy (Vit-D Deficiency, Fractures)         POCT Glucose (Device for Home Use)         POCT glycosylated hemoglobin (Hb A1C)    - Influenza vaccine given.   3. Hypovitaminosis D  - 2000 units of vitamin D per day  - Vitamin D level  ordered   4 Insulin pump titration  12AM 0.70--> 0.75  6am 1pm 0.80 --> 0.90  0.90 --> 1.0   5pm 0.85 --> 0.95  10pm 0.85 --> 0.95      21.45 units per day  Insulin to Carbohydrate Ratio 12AM 10  6am 1pm 9 9   5pm 7.5 --> 7  10pm 10 --> 9        Insulin Sensitivity Factor 12AM 40  6am 1pm  45  38  5pm 38   10pm 40       Target Blood Glucose 12AM 120                - Lantus 20 units  - Novolog 120/40/8    Follow-up:   3 months.     LOS: >40  spent today reviewing the medical chart, counseling the patient/family, and documenting today's visit.   When a patient is on insulin, intensive monitoring of blood glucose levels is necessary to avoid hyperglycemia and hypoglycemia. Severe hyperglycemia/hypoglycemia can lead to hospital admissions and be life threatening.    Gretchen Short, DNP, FNP-C  Pediatric Specialist  [address]  [place], [address]  Tele: [phone]

## 2023-03-15 LAB — COMPLETE METABOLIC PANEL WITH GFR
AG Ratio: 1.4 (calc) (ref 1.0–2.5)
ALT: 7 U/L (ref 6–29)
AST: 13 U/L (ref 10–30)
Albumin: 4.2 g/dL (ref 3.6–5.1)
Alkaline phosphatase (APISO): 68 U/L (ref 31–125)
BUN: 9 mg/dL (ref 7–25)
CO2: 28 mmol/L (ref 20–32)
Calcium: 9.3 mg/dL (ref 8.6–10.2)
Chloride: 104 mmol/L (ref 98–110)
Creat: 0.8 mg/dL (ref 0.50–0.96)
Globulin: 3.1 g/dL (ref 1.9–3.7)
Glucose, Bld: 144 mg/dL — ABNORMAL HIGH (ref 65–139)
Potassium: 3.9 mmol/L (ref 3.5–5.3)
Sodium: 137 mmol/L (ref 135–146)
Total Bilirubin: 0.8 mg/dL (ref 0.2–1.2)
Total Protein: 7.3 g/dL (ref 6.1–8.1)
eGFR: 106 mL/min/{1.73_m2} (ref >=60)

## 2023-03-15 LAB — VITAMIN D 25 HYDROXY (VIT D DEFICIENCY, FRACTURES): Vit D, 25-Hydroxy: 34 ng/mL (ref 30–100)

## 2023-03-15 LAB — LIPID PANEL
Cholesterol: 207 mg/dL — ABNORMAL HIGH (ref <200)
HDL: 67 mg/dL (ref >=50)
LDL Cholesterol (Calc): 120 mg/dL — ABNORMAL HIGH
Non-HDL Cholesterol (Calc): 140 mg/dL — ABNORMAL HIGH (ref <130)
Total CHOL/HDL Ratio: 3.1 (calc) (ref <5.0)
Triglycerides: 92 mg/dL (ref <150)

## 2023-03-15 LAB — MICROALBUMIN / CREATININE URINE RATIO
Creatinine, Urine: 355 mg/dL — ABNORMAL HIGH (ref 20–275)
Microalb Creat Ratio: 3 mg/g{creat} (ref <30)
Microalb, Ur: 1.2 mg/dL

## 2023-03-15 LAB — T4, FREE: Free T4: 1.1 ng/dL (ref 0.8–1.8)

## 2023-03-15 LAB — TSH: TSH: 3.27 m[IU]/L

## 2023-04-12 ENCOUNTER — Encounter (INDEPENDENT_AMBULATORY_CARE_PROVIDER_SITE_OTHER): Payer: Self-pay

## 2023-04-26 ENCOUNTER — Encounter (INDEPENDENT_AMBULATORY_CARE_PROVIDER_SITE_OTHER): Payer: Self-pay

## 2023-04-26 DIAGNOSIS — E1065 Type 1 diabetes mellitus with hyperglycemia: Secondary | ICD-10-CM

## 2023-04-26 MED ORDER — GVOKE HYPOPEN 2-PACK 1 MG/0.2ML ~~LOC~~ SOAJ: 1.0000 | SUBCUTANEOUS | 1 refills | Status: AC | PRN

## 2023-05-11 ENCOUNTER — Encounter (INDEPENDENT_AMBULATORY_CARE_PROVIDER_SITE_OTHER): Payer: Self-pay

## 2023-06-03 ENCOUNTER — Ambulatory Visit (HOSPITAL_COMMUNITY)
Admission: EM | Admit: 2023-06-03 | Discharge: 2023-06-03 | Disposition: A | Discharge status: Home / Self Care | Payer: Managed Care, Other (non HMO) | Attending: Emergency Medicine | Admitting: Emergency Medicine

## 2023-06-03 ENCOUNTER — Encounter (HOSPITAL_COMMUNITY): Payer: Self-pay

## 2023-06-03 DIAGNOSIS — Z760 Encounter for issue of repeat prescription: Secondary | ICD-10-CM

## 2023-06-03 DIAGNOSIS — L309 Dermatitis, unspecified: Secondary | ICD-10-CM

## 2023-06-03 MED ORDER — TRIAMCINOLONE ACETONIDE 0.025 % EX OINT: 1.0000 | TOPICAL_OINTMENT | Freq: Two times a day (BID) | CUTANEOUS | 0 refills | Status: AC

## 2023-06-03 NOTE — ED Provider Notes (Signed)
 [facility]    CSN: [id number] Arrival date & time: 06/03/23  9195      History   Chief Complaint Chief Complaint  Patient presents with   Rash    HPI [name] is a 23 y.o. female.   Patient presents to clinic with mother, reports that her eczema has been flared up for the past 7 days.  She has some to the right inner elbow area but it is worse along the nape of her neck.   She has been trying CeraVe lotion for diabetics without much improvement.  She does have a history of eczema, the rash normally improves with triamcinolone  ointment/cream.  She does not have a primary care provider.  Patient has type 1 diabetes and sees endocrinology for this.  The history is provided by the patient, medical records and a parent.  Rash   Past Medical History:  Diagnosis Date   Diabetes mellitus without complication (HCC)    Eczema     Patient Active Problem List   Diagnosis Date Noted   Hypovitaminosis D 09/13/2022   Insulin  pump titration 04/07/2020   Hyperglycemia 09/01/2017   Insulin  dose changed (HCC) 09/01/2017   Maladaptive health behaviors affecting medical condition 08/01/2017   Type 1 diabetes mellitus (HCC) 07/05/2016   Elevated blood pressure 04/17/2015   Adjustment reaction of adult life     Past Surgical History:  Procedure Laterality Date   WISDOM TOOTH EXTRACTION      OB History   No obstetric history on file.      Home Medications    Prior to Admission medications   Medication Sig Start Date End Date Taking? Authorizing Provider  triamcinolone  (KENALOG ) 0.025 % ointment Apply 1 Application topically 2 (two) times daily. 1 application twice daily, do not use for longer than 7 days in a row as this can cause skin thinning. 06/03/23  Yes Latysha Thackston  N, FNP  acetone, urine, test strip 1 strip by Does not apply route as needed for high blood sugar. Patient not taking: Reported on 05/04/2021 03/22/15   Lue Lavanda Pac, MD  BD  PEN NEEDLE NANO 2ND GEN 32G X 4 MM MISC USE TO INJECT INSULIN  AS DIRECTED. Patient not taking: Reported on 05/04/2021 09/18/19   Verdon Darnel, NP  Blood Glucose Monitoring Suppl (ONETOUCH VERIO FLEX SYSTEM) w/Device KIT Use to check glucose Patient not taking: Reported on 05/04/2021 11/18/17   Verdon Darnel, NP  diclofenac  (VOLTAREN ) 75 MG EC tablet Take 1 tablet (75 mg total) by mouth 2 (two) times daily. Patient not taking: Reported on 05/04/2021 02/13/20   Rolinda Rogue, MD  Glucagon  (GVOKE HYPOPEN  2-PACK) 1 MG/0.2ML SOAJ Inject 1 Dose into the skin as needed. 04/26/23   Verdon Darnel, NP  insulin  aspart (NOVOLOG ) 100 UNIT/ML injection INJECT UP TO 300 UNITS INTO PUMP EVERY 48 HOURS 03/14/23   Verdon Darnel, NP  insulin  glargine (SEMGLEE , YFGN,) 100 UNIT/ML Solostar Pen INJECT UP TO 50 UNITS SUBCUTANEOUSLY DAILY Patient not taking: Reported on 02/22/2022 05/19/21   Verdon Darnel, NP  NOVOLOG  FLEXPEN 100 UNIT/ML FlexPen USE UP TO 50 UNITS DAILY 05/18/21   Verdon Darnel, NP  ONE TOUCH LANCETS MISC Use to check blood sugar up to six times a day. Patient not taking: Reported on 03/14/2023 03/22/15   Lue Lavanda Pac, MD  [facility] VERIO test strip CHECK BLOOD SUGAR 6 TIMES DAILY. Patient not taking: Reported on 11/23/2021 08/17/21   Verdon Darnel, NP  cetirizine (ZYRTEC) 10 MG tablet Take  10 mg by mouth daily as needed for allergies. Reported on 07/22/2015 Patient not taking: Reported on 12/31/2019  02/13/20  [provider]    Family History Family History  Problem Relation Age of Onset   Graves' disease Mother    Hypertension Mother    Hypertension Father     Social History Social History   Tobacco Use   Smoking status: Never   Smokeless tobacco: Never  Vaping Use   Vaping status: Never Used  Substance Use Topics   Alcohol use: No   Drug use: No     Allergies   Apple fiber, Apple fruit extract, and Cherry   Review of Systems Review of  Systems  Per HPI   Physical Exam Triage Vital Signs ED Triage Vitals  Encounter Vitals Group     BP 06/03/23 0822 119/86     Systolic BP Percentile --      Diastolic BP Percentile --      Pulse Rate 06/03/23 0822 94     Resp 06/03/23 0822 16     Temp 06/03/23 0822 98 F (36.7 C)     Temp Source 06/03/23 0822 Oral     SpO2 06/03/23 0822 98 %     Weight --      Height --      Head Circumference --      Peak Flow --      Pain Score 06/03/23 0823 0     Pain Loc --      Pain Education --      Exclude from Growth Chart --    No data found.  Updated Vital Signs BP 119/86 (BP Location: Right Arm)   Pulse 94   Temp 98 F (36.7 C) (Oral)   Resp 16   LMP 05/21/2023 (Exact Date)   SpO2 98%   Visual Acuity Right Eye Distance:   Left Eye Distance:   Bilateral Distance:    Right Eye Near:   Left Eye Near:    Bilateral Near:     Physical Exam Vitals and nursing note reviewed.  Constitutional:      Appearance: Normal appearance.  HENT:     Head: Normocephalic and atraumatic.     Right Ear: External ear normal.     Left Ear: External ear normal.     Nose: Nose normal.     Mouth/Throat:     Mouth: Mucous membranes are moist.  Eyes:     Conjunctiva/sclera: Conjunctivae normal.  Cardiovascular:     Rate and Rhythm: Normal rate.  Pulmonary:     Effort: Pulmonary effort is normal. No respiratory distress.  Musculoskeletal:        General: Normal range of motion.  Skin:    General: Skin is warm and dry.     Findings: Rash present. Rash is urticarial.          Comments: Excoriated, dry scaling rash to right inner elbow and the nape of the neck as well as upper shoulder areas consistent with atopic dermatitis.  Neurological:     General: No focal deficit present.     Mental Status: She is alert and oriented to person, place, and time.  Psychiatric:        Behavior: Behavior normal. Behavior is cooperative.      UC Treatments / Results  Labs (all labs ordered  are listed, but only abnormal results are displayed) Labs Reviewed - No data to display  EKG   Radiology No results found.  Procedures Procedures (including critical care time)  Medications Ordered in UC Medications - No data to display  Initial Impression / Assessment and Plan / UC Course  I have reviewed the triage vital signs and the nursing notes.  Pertinent labs & imaging results that were available during my care of the patient were reviewed by me and considered in my medical decision making (see chart for details).  Vitals and triage reviewed, patient is hemodynamically stable.  Appears to be having an eczema flareup, history of same.  Will refill triamcinolone  ointment, as this works well for patient.  Encouraged primary care follow-up for further management of eczema.  Plan of care, follow-up care and return precautions given, no questions at this time.     Final Clinical Impressions(s) / UC Diagnoses   Final diagnoses:  Eczema, unspecified type  Medication refill     Discharge Instructions      I have sent in a refill of your triamcinolone  steroid ointment.  You can use this twice daily for the next 7 days.  Do not use this for longer than 7 days in a row, you can take a week break and then use again if needed.  It is important that you follow-up with a primary care provider, as they will be able to provide you with refills throughout the year and more effective management of your eczema.  Continue to use your unscented moisturizer and a gentle hypoallergenic soap as well as laundry detergent to help prevent further exacerbation of your eczema.  Return to clinic for any new or urgent symptoms.     ED Prescriptions     Medication Sig Dispense Auth. Provider   triamcinolone  (KENALOG ) 0.025 % ointment Apply 1 Application topically 2 (two) times daily. 1 application twice daily, do not use for longer than 7 days in a row as this can cause skin thinning. 454 g  Dreama Amos SAILOR, FNP      PDMP not reviewed this encounter.   Dreama, Romelle Reiley  N, FNP 06/03/23 [phone]

## 2023-06-03 NOTE — ED Triage Notes (Signed)
 Pt states her eczema flared up for the past seven days. States it is all over her body.  States she was using cerave lotion at home.

## 2023-06-03 NOTE — Discharge Instructions (Addendum)
 I have sent in a refill of your triamcinolone  steroid ointment.  You can use this twice daily for the next 7 days.  Do not use this for longer than 7 days in a row, you can take a week break and then use again if needed.  It is important that you follow-up with a primary care provider, as they will be able to provide you with refills throughout the year and more effective management of your eczema.  Continue to use your unscented moisturizer and a gentle hypoallergenic soap as well as laundry detergent to help prevent further exacerbation of your eczema.  Return to clinic for any new or urgent symptoms.

## 2023-06-07 ENCOUNTER — Ambulatory Visit (HOSPITAL_COMMUNITY)
Admission: EM | Admit: 2023-06-07 | Discharge: 2023-06-07 | Disposition: A | Discharge status: Home / Self Care | Payer: Managed Care, Other (non HMO) | Attending: Family Medicine | Admitting: Family Medicine

## 2023-06-07 ENCOUNTER — Encounter (HOSPITAL_COMMUNITY): Payer: Self-pay

## 2023-06-07 DIAGNOSIS — J069 Acute upper respiratory infection, unspecified: Secondary | ICD-10-CM | POA: Diagnosis present

## 2023-06-07 DIAGNOSIS — Z20822 Contact with and (suspected) exposure to covid-19: Secondary | ICD-10-CM | POA: Diagnosis not present

## 2023-06-07 MED ORDER — BENZONATATE 100 MG PO CAPS: 100.0000 mg | ORAL_CAPSULE | Freq: Three times a day (TID) | ORAL | 0 refills | Status: AC | PRN

## 2023-06-07 NOTE — ED Provider Notes (Signed)
 [facility]    CSN: [id number] Arrival date & time: 06/07/23  1019      History   Chief Complaint Chief Complaint  Patient presents with   Cough   Otalgia   Sore Throat   Nasal Congestion   Generalized Body Aches   Headache    HPI [name] is a 23 y.o. female.    Cough Associated symptoms: ear pain and headaches   Otalgia Associated symptoms: cough and headaches   Sore Throat Associated symptoms include headaches.  Headache Associated symptoms: cough and ear pain   Here for cough and congestion and sore throat and headache and ear pain.  Symptoms began 2 days ago.  She has not had any fever or chills but she has had some myalgia and malaise.  She was exposed to her brother on January 1 who later his let her know if he had COVID at that time.  Last menstrual cycle was December 21  NKDA  Past medical history significant for type 1 diabetes.  Sugars have been higher since she has been ill  Past Medical History:  Diagnosis Date   Diabetes mellitus without complication (HCC)    Eczema     Patient Active Problem List   Diagnosis Date Noted   Hypovitaminosis D 09/13/2022   Insulin  pump titration 04/07/2020   Hyperglycemia 09/01/2017   Insulin  dose changed (HCC) 09/01/2017   Maladaptive health behaviors affecting medical condition 08/01/2017   Type 1 diabetes mellitus (HCC) 07/05/2016   Elevated blood pressure 04/17/2015   Adjustment reaction of adult life     Past Surgical History:  Procedure Laterality Date   WISDOM TOOTH EXTRACTION      OB History   No obstetric history on file.      Home Medications    Prior to Admission medications   Medication Sig Start Date End Date Taking? Authorizing Provider  benzonatate  (TESSALON ) 100 MG capsule Take 1 capsule (100 mg total) by mouth 3 (three) times daily as needed for cough. 06/07/23  Yes Krizia Flight K, MD  acetone, urine, test strip 1 strip by Does not apply route as needed for high  blood sugar. Patient not taking: Reported on 05/04/2021 03/22/15   Lue Lavanda Pac, MD  BD PEN NEEDLE NANO 2ND GEN 32G X 4 MM MISC USE TO INJECT INSULIN  AS DIRECTED. Patient not taking: Reported on 05/04/2021 09/18/19   Verdon Darnel, NP  Blood Glucose Monitoring Suppl (ONETOUCH VERIO FLEX SYSTEM) w/Device KIT Use to check glucose Patient not taking: Reported on 05/04/2021 11/18/17   Verdon Darnel, NP  diclofenac  (VOLTAREN ) 75 MG EC tablet Take 1 tablet (75 mg total) by mouth 2 (two) times daily. Patient not taking: Reported on 05/04/2021 02/13/20   Rolinda Rogue, MD  Glucagon  (GVOKE HYPOPEN  2-PACK) 1 MG/0.2ML SOAJ Inject 1 Dose into the skin as needed. 04/26/23   Verdon Darnel, NP  insulin  aspart (NOVOLOG ) 100 UNIT/ML injection INJECT UP TO 300 UNITS INTO PUMP EVERY 48 HOURS 03/14/23   Verdon Darnel, NP  insulin  glargine (SEMGLEE , YFGN,) 100 UNIT/ML Solostar Pen INJECT UP TO 50 UNITS SUBCUTANEOUSLY DAILY Patient not taking: Reported on 02/22/2022 05/19/21   Verdon Darnel, NP  NOVOLOG  FLEXPEN 100 UNIT/ML FlexPen USE UP TO 50 UNITS DAILY 05/18/21   Verdon Darnel, NP  ONE TOUCH LANCETS MISC Use to check blood sugar up to six times a day. Patient not taking: Reported on 03/14/2023 03/22/15   Lue Lavanda Pac, MD  [facility] VERIO test strip CHECK  BLOOD SUGAR 6 TIMES DAILY. Patient not taking: Reported on 11/23/2021 08/17/21   Verdon Darnel, NP  triamcinolone  (KENALOG ) 0.025 % ointment Apply 1 Application topically 2 (two) times daily. 1 application twice daily, do not use for longer than 7 days in a row as this can cause skin thinning. 06/03/23   Dreama, [place]  N, FNP  cetirizine (ZYRTEC) 10 MG tablet Take 10 mg by mouth daily as needed for allergies. Reported on 07/22/2015 Patient not taking: Reported on 12/31/2019  02/13/20  [provider]    Family History Family History  Problem Relation Age of Onset   Graves' disease Mother    Hypertension Mother     Hypertension Father     Social History Social History   Tobacco Use   Smoking status: Never   Smokeless tobacco: Never  Vaping Use   Vaping status: Every Day   Substances: Nicotine, Flavoring  Substance Use Topics   Alcohol use: No   Drug use: No     Allergies   Apple fiber, Apple fruit extract, and Cherry   Review of Systems Review of Systems  HENT:  Positive for ear pain.   Respiratory:  Positive for cough.   Neurological:  Positive for headaches.     Physical Exam Triage Vital Signs ED Triage Vitals  Encounter Vitals Group     BP 06/07/23 1104 121/73     Systolic BP Percentile --      Diastolic BP Percentile --      Pulse Rate 06/07/23 1104 (!) 115     Resp 06/07/23 1104 16     Temp 06/07/23 1104 98.5 F (36.9 C)     Temp Source 06/07/23 1104 Oral     SpO2 06/07/23 1104 98 %     Weight --      Height --      Head Circumference --      Peak Flow --      Pain Score 06/07/23 1106 10     Pain Loc --      Pain Education --      Exclude from Growth Chart --    No data found.  Updated Vital Signs BP 121/73 (BP Location: Left Arm)   Pulse (!) 115   Temp 98.5 F (36.9 C) (Oral)   Resp 16   LMP 05/21/2023 (Exact Date)   SpO2 98%   Visual Acuity Right Eye Distance:   Left Eye Distance:   Bilateral Distance:    Right Eye Near:   Left Eye Near:    Bilateral Near:     Physical Exam Vitals reviewed.  Constitutional:      General: She is not in acute distress.    Appearance: She is not ill-appearing, toxic-appearing or diaphoretic.  HENT:     Right Ear: Tympanic membrane and ear canal normal.     Left Ear: Tympanic membrane and ear canal normal.     Nose: Congestion present.     Mouth/Throat:     Mouth: Mucous membranes are moist.     Comments: There is no erythema but there is some clear mucus draining Eyes:     Extraocular Movements: Extraocular movements intact.     Conjunctiva/sclera: Conjunctivae normal.     Pupils: Pupils are equal,  round, and reactive to light.  Cardiovascular:     Rate and Rhythm: Normal rate and regular rhythm.     Heart sounds: No murmur heard. Pulmonary:     Effort: Pulmonary effort is normal.  No respiratory distress.     Breath sounds: No stridor. No wheezing, rhonchi or rales.  Musculoskeletal:     Cervical back: Neck supple.  Lymphadenopathy:     Cervical: No cervical adenopathy.  Skin:    Capillary Refill: Capillary refill takes less than 2 seconds.     Coloration: Skin is not jaundiced or pale.  Neurological:     General: No focal deficit present.     Mental Status: She is alert and oriented to person, place, and time.  Psychiatric:        Behavior: Behavior normal.      UC Treatments / Results  Labs (all labs ordered are listed, but only abnormal results are displayed) Labs Reviewed  SARS CORONAVIRUS 2 (TAT 6-24 HRS)    EKG   Radiology No results found.  Procedures Procedures (including critical care time)  Medications Ordered in UC Medications - No data to display  Initial Impression / Assessment and Plan / UC Course  I have reviewed the triage vital signs and the nursing notes.  Pertinent labs & imaging results that were available during my care of the patient were reviewed by me and considered in my medical decision making (see chart for details).     COVID swab is done and we will notify if positive, and if positive I think she should have a prescription for Paxlovid, due to her type 1 diabetes history.  Her last EGFR was 106.  Tessalon  Perles are sent in for the cough  Final Clinical Impressions(s) / UC Diagnoses   Final diagnoses:  Viral URI with cough  Exposure to COVID-19 virus     Discharge Instructions      Take benzonatate  100 mg, 1 tab every 8 hours as needed for cough.   You have been swabbed for COVID, and the test will result in the next 24 hours. Our staff will call you if positive. If the COVID test is positive, you should quarantine  until you are fever free for 24 hours and you are starting to feel better, and then take added precautions for the next 5 days, such as physical distancing/wearing a mask and good hand hygiene/washing.      ED Prescriptions     Medication Sig Dispense Auth. Provider   benzonatate  (TESSALON ) 100 MG capsule Take 1 capsule (100 mg total) by mouth 3 (three) times daily as needed for cough. 21 capsule Vila Dory K, MD      PDMP not reviewed this encounter.   Vonna Sharlet POUR, MD 06/07/23 [id number]

## 2023-06-07 NOTE — ED Triage Notes (Signed)
 Patient c/o generalized body aches, headache, bilateral ear pain, nasal congestion, a non productive cough, and sore throat x 2 days.  Patient states she has been taking Nyquil and benadryl.

## 2023-06-07 NOTE — Discharge Instructions (Addendum)
Take benzonatate 100 mg, 1 tab every 8 hours as needed for cough.   You have been swabbed for COVID, and the test will result in the next 24 hours. Our staff will call you if positive. If the COVID test is positive, you should quarantine until you are fever free for 24 hours and you are starting to feel better, and then take added precautions for the next 5 days, such as physical distancing/wearing a mask and good hand hygiene/washing.  

## 2023-06-08 LAB — SARS CORONAVIRUS 2 (TAT 6-24 HRS): SARS Coronavirus 2: POSITIVE — AB

## 2023-07-24 ENCOUNTER — Encounter (HOSPITAL_COMMUNITY): Payer: Self-pay

## 2023-07-24 ENCOUNTER — Ambulatory Visit (HOSPITAL_COMMUNITY)
Admission: EM | Admit: 2023-07-24 | Discharge: 2023-07-24 | Disposition: A | Discharge status: Home / Self Care | Payer: Managed Care, Other (non HMO) | Attending: Emergency Medicine | Admitting: Emergency Medicine

## 2023-07-24 DIAGNOSIS — Z23 Encounter for immunization: Secondary | ICD-10-CM

## 2023-07-24 DIAGNOSIS — R238 Other skin changes: Secondary | ICD-10-CM | POA: Diagnosis not present

## 2023-07-24 DIAGNOSIS — T4995XA Adverse effect of unspecified topical agent, initial encounter: Secondary | ICD-10-CM

## 2023-07-24 MED ORDER — SILVER SULFADIAZINE 1 % EX CREA: 1.0000 | TOPICAL_CREAM | Freq: Two times a day (BID) | CUTANEOUS | 0 refills | Status: AC

## 2023-07-24 MED ORDER — TETANUS-DIPHTH-ACELL PERTUSSIS 5-2.5-18.5 LF-MCG/0.5 IM SUSY
PREFILLED_SYRINGE | INTRAMUSCULAR | Status: AC
  Filled 2023-07-24: qty 0.5

## 2023-07-24 MED ORDER — TETANUS-DIPHTH-ACELL PERTUSSIS 5-2.5-18.5 LF-MCG/0.5 IM SUSY
0.5000 mL | PREFILLED_SYRINGE | Freq: Once | INTRAMUSCULAR | Status: AC
  Administered 2023-07-24: 0.5 mL via INTRAMUSCULAR

## 2023-07-24 NOTE — ED Provider Notes (Signed)
 [facility]    CSN: [id number] Arrival date & time: 07/24/23  1023      History   Chief Complaint Chief Complaint  Patient presents with   chemical burn    HPI [name] is a 23 y.o. female.  Last night slipped on degreaser that was used on the floor Has burning sensation on her buttocks, some rash although reports she was wearing pants Applied Vaseline   Did not get any in eyes or mouth  Past Medical History:  Diagnosis Date   Diabetes mellitus without complication (HCC)    Eczema     Patient Active Problem List   Diagnosis Date Noted   Hypovitaminosis D 09/13/2022   Insulin pump titration 04/07/2020   Hyperglycemia 09/01/2017   Insulin dose changed (HCC) 09/01/2017   Maladaptive health behaviors affecting medical condition 08/01/2017   Type 1 diabetes mellitus (HCC) 07/05/2016   Elevated blood pressure 04/17/2015   Adjustment reaction of adult life     Past Surgical History:  Procedure Laterality Date   WISDOM TOOTH EXTRACTION      OB History   No obstetric history on file.      Home Medications    Prior to Admission medications   Medication Sig Start Date End Date Taking? Authorizing Provider  silver sulfADIAZINE (SILVADENE) 1 % cream Apply 1 Application topically 2 (two) times daily. 07/24/23  Yes Yulissa Needham, PA-C  acetone, urine, test strip 1 strip by Does not apply route as needed for high blood sugar. Patient not taking: Reported on 05/04/2021 03/22/15   Marquette Saa, MD  BD PEN NEEDLE NANO 2ND GEN 32G X 4 MM MISC USE TO INJECT INSULIN AS DIRECTED. Patient not taking: Reported on 05/04/2021 09/18/19   Gretchen Short, NP  benzonatate (TESSALON) 100 MG capsule Take 1 capsule (100 mg total) by mouth 3 (three) times daily as needed for cough. 06/07/23   Zenia Resides, MD  Blood Glucose Monitoring Suppl (ONETOUCH VERIO FLEX SYSTEM) w/Device KIT Use to check glucose Patient not taking: Reported on 05/04/2021 11/18/17    Gretchen Short, NP  diclofenac (VOLTAREN) 75 MG EC tablet Take 1 tablet (75 mg total) by mouth 2 (two) times daily. Patient not taking: Reported on 05/04/2021 02/13/20   Mardella Layman, MD  Glucagon (GVOKE HYPOPEN 2-PACK) 1 MG/0.2ML SOAJ Inject 1 Dose into the skin as needed. 04/26/23   Gretchen Short, NP  insulin aspart (NOVOLOG) 100 UNIT/ML injection INJECT UP TO 300 UNITS INTO PUMP EVERY 48 HOURS 03/14/23   Gretchen Short, NP  insulin glargine (SEMGLEE, YFGN,) 100 UNIT/ML Solostar Pen INJECT UP TO 50 UNITS SUBCUTANEOUSLY DAILY Patient not taking: Reported on 02/22/2022 05/19/21   Gretchen Short, NP  NOVOLOG FLEXPEN 100 UNIT/ML FlexPen USE UP TO 50 UNITS DAILY 05/18/21   Gretchen Short, NP  ONE TOUCH LANCETS MISC Use to check blood sugar up to six times a day. Patient not taking: Reported on 03/14/2023 03/22/15   Marquette Saa, MD  [facility] VERIO test strip CHECK BLOOD SUGAR 6 TIMES DAILY. Patient not taking: Reported on 11/23/2021 08/17/21   Gretchen Short, NP  triamcinolone (KENALOG) 0.025 % ointment Apply 1 Application topically 2 (two) times daily. 1 application twice daily, do not use for longer than 7 days in a row as this can cause skin thinning. 06/03/23   Garrison, [place] N, FNP  cetirizine (ZYRTEC) 10 MG tablet Take 10 mg by mouth daily as needed for allergies. Reported on 07/22/2015 Patient not taking: Reported  on 12/31/2019  02/13/20  [provider]    Family History Family History  Problem Relation Age of Onset   Graves' disease Mother    Hypertension Mother    Hypertension Father     Social History Social History   Tobacco Use   Smoking status: Never   Smokeless tobacco: Never  Vaping Use   Vaping status: Every Day   Substances: Nicotine, Flavoring  Substance Use Topics   Alcohol use: No   Drug use: No     Allergies   Apple fiber, Apple fruit extract, and Cherry   Review of Systems Review of Systems Per HPI  Physical Exam Triage  Vital Signs ED Triage Vitals [07/24/23 1119]  Encounter Vitals Group     BP 126/83     Systolic BP Percentile      Diastolic BP Percentile      Pulse Rate 90     Resp 14     Temp 98 F (36.7 C)     Temp Source Oral     SpO2 97 %     Weight      Height      Head Circumference      Peak Flow      Pain Score 7     Pain Loc      Pain Education      Exclude from Growth Chart    No data found.  Updated Vital Signs BP 126/83 (BP Location: Right Arm)   Pulse 90   Temp 98 F (36.7 C) (Oral)   Resp 14   LMP 07/17/2023   SpO2 97%    Physical Exam Vitals and nursing note reviewed.  Constitutional:      General: She is not in acute distress.    Appearance: She is not ill-appearing.     Comments: Sitting comfortably on exam table  HENT:     Mouth/Throat:     Mouth: Mucous membranes are moist.     Pharynx: Oropharynx is clear.  Cardiovascular:     Rate and Rhythm: Normal rate and regular rhythm.     Heart sounds: Normal heart sounds.  Pulmonary:     Effort: Pulmonary effort is normal.     Breath sounds: Normal breath sounds.  Skin:    General: Skin is warm and dry.          Comments: Chaperone present Chief Technology Officer. There is some irritation of bilateral buttocks. No blistering or erythema. No drainage.   Neurological:     Mental Status: She is alert and oriented to person, place, and time.      UC Treatments / Results  Labs (all labs ordered are listed, but only abnormal results are displayed) Labs Reviewed - No data to display  EKG   Radiology No results found.  Procedures Procedures (including critical care time)  Medications Ordered in UC Medications  Tdap (BOOSTRIX) injection 0.5 mL (has no administration in time range)    Initial Impression / Assessment and Plan / UC Course  I have reviewed the triage vital signs and the nursing notes.  Pertinent labs & imaging results that were available during my care of the patient were reviewed by me and  considered in my medical decision making (see chart for details).  Well appearing.  Skin irritation likely from chemicals in degreaser product There is no blistering or vesicles Discussed irritation, less likely burn; appears more like carpet burn. Can apply silvadene cream BID, keep clean Last tetanus >  10 years ago, updated today. Follow with primary care. Return and ED precautions Patient agrees to plan  Final Clinical Impressions(s) / UC Diagnoses   Final diagnoses:  Skin irritation due to topical agent     Discharge Instructions      We have updated your tetanus vaccine today Apply the silvadene cream twice daily for the next week or so Skin should heal on its own but may be uncomfortable for a few days With any concerns please follow with your primary care provider     ED Prescriptions     Medication Sig Dispense Auth. Provider   silver sulfADIAZINE (SILVADENE) 1 % cream Apply 1 Application topically 2 (two) times daily. 50 g Lisa-Marie Rueger, Lurena Joiner, PA-C      PDMP not reviewed this encounter.   Vara Mairena, Lurena Joiner, [place] 07/24/23 1204

## 2023-07-24 NOTE — ED Triage Notes (Signed)
 Patient reports that she slipped in some degreaser that was on the floor last night and has blisters to her buttocks from  landing in it.  Patient's boyfriend states the patient took a shower then he put vaseline on the area.

## 2023-07-24 NOTE — Discharge Instructions (Signed)
 We have updated your tetanus vaccine today Apply the silvadene cream twice daily for the next week or so Skin should heal on its own but may be uncomfortable for a few days With any concerns please follow with your primary care provider

## 2023-08-04 NOTE — Progress Notes (Signed)
 Name: [name]  MRN/ DOB: [id number], [birth year]   Age/ Sex: 23 y.o., female    PCP: Patient, No Pcp Per   Reason for Endocrinology Evaluation: Type 1 Diabetes Mellitus     Date of Initial Endocrinology Visit: 08/05/2023     PATIENT IDENTIFIER: [name] is a 23 y.o. female with a past medical history of DM. The patient presented for initial endocrinology clinic visit on 08/05/2023 for consultative assistance with her diabetes management.    HPI: [name] is accompanied by her mother and fianc   Diagnosed with DM in 2016 Currently checking blood sugars multiple x / day, Hypoglycemia episodes : no           Hemoglobin A1c has ranged from 5.7% in 2022, peaking at 7.3% in 2024.   In terms of diet, the patient eats 2-3 meals a day, snacks occasionally.   Denies nausea or vomiting  Denies constipation or diarrhea  She has a left foot pain , she had this evaluated by urgent care and was told she has tendonitis   This patient with type 1 diabetes is treated with T:Slim  (insulin pump). During the visit the pump basal and bolus doses were reviewed including carb/insulin rations and supplemental doses. The clinical list was updated. The glucose meter download was reviewed in detail to determine if the current pump settings are providing the best glycemic control without excessive hypoglycemia.  Pump and meter download:    Pump   Tandem  Settings  I:C  SF Goal  Insulin type   Novolog      Basal rate          0000 0.73 u/h  10 40 120   0600 0.80 9 45    1300 0.9 u/h  9 45    1700  0.85 u/h  8.5 30    2200 0.85 10 40                    Type & Model of Pump: tandem Insulin Type: Currently using Novolog .  Body mass index is 28.76 kg/m.  PUMP STATISTICS: Unable to download     HOME DIABETES REGIMEN: Novolog    Statin: no ACE-I/ARB: no Prior Diabetic Education: yes   CGM: unable to download     DIABETIC COMPLICATIONS: Microvascular complications:    Denies: Neuropathy, retinopathy,  Last eye exam: Completed 2024  Macrovascular complications:   Denies: CAD, PVD, CVA   PAST HISTORY: Past Medical History:  Past Medical History:  Diagnosis Date   Diabetes mellitus without complication (HCC)    Eczema    Past Surgical History:  Past Surgical History:  Procedure Laterality Date   WISDOM TOOTH EXTRACTION      Social History:  reports that she has never smoked. She has never used smokeless tobacco. She reports that she does not drink alcohol and does not use drugs. Family History:  Family History  Problem Relation Age of Onset   Graves' disease Mother    Hypertension Mother    Hypertension Father      HOME MEDICATIONS: Allergies as of 08/05/2023       Reactions   Apple Fiber Anaphylaxis   Swelling of face and throat when eats apple peelings   Apple Fruit Extract Other (See Comments)   Cherry    Throat and lip swelling         Medication List        Accurate as of August 05, 2023  11:29 AM. If you have any questions, ask your nurse or doctor.          STOP taking these medications    Semglee (yfgn) 100 UNIT/ML Pen Generic drug: insulin glargine Stopped by: Johnney Ou Danie Diehl       TAKE these medications    acetone (urine) test strip 1 strip by Does not apply route as needed for high blood sugar.   BD Pen Needle Nano 2nd Gen 32G X 4 MM Misc Generic drug: Insulin Pen Needle USE TO INJECT INSULIN AS DIRECTED.   benzonatate 100 MG capsule Commonly known as: TESSALON Take 1 capsule (100 mg total) by mouth 3 (three) times daily as needed for cough.   Dexcom G7 Sensor Misc 1 Device by Does not apply route as directed. Started by: Johnney Ou Ritisha Deitrick   diclofenac 75 MG EC tablet Commonly known as: VOLTAREN Take 1 tablet (75 mg total) by mouth 2 (two) times daily.   Gvoke HypoPen 2-Pack 1 MG/0.2ML Soaj Generic drug: Glucagon Inject 1 Dose into the skin as needed.   insulin aspart 100 UNIT/ML  injection Commonly known as: NovoLOG INJECT UP TO 60 UNITS INTO PUMP EVERY day What changed:  additional instructions Another medication with the same name was removed. Continue taking this medication, and follow the directions you see here. Changed by: Johnney Ou Jumaane Weatherford   ONE TOUCH LANCETS Misc Use to check blood sugar up to six times a day.   OneTouch Verio Flex System w/Device Kit Use to check glucose   OneTouch Verio test strip Generic drug: glucose blood CHECK BLOOD SUGAR 6 TIMES DAILY.   silver sulfADIAZINE 1 % cream Commonly known as: SILVADENE Apply 1 Application topically 2 (two) times daily.   triamcinolone 0.025 % ointment Commonly known as: KENALOG Apply 1 Application topically 2 (two) times daily. 1 application twice daily, do not use for longer than 7 days in a row as this can cause skin thinning.         ALLERGIES: Allergies  Allergen Reactions   Apple Fiber Anaphylaxis    Swelling of face and throat when eats apple peelings   Apple Fruit Extract Other (See Comments)   Cherry     Throat and lip swelling      REVIEW OF SYSTEMS: A comprehensive ROS was conducted with the patient and is negative except as per HPI     OBJECTIVE:   VITAL SIGNS: BP 110/70 (BP Location: Left Arm, Patient Position: Sitting, Cuff Size: Small)   Pulse 89   Ht 4' 10.5" (1.486 m)   Wt 140 lb (63.5 kg)   LMP 07/17/2023   SpO2 98%   BMI 28.76 kg/m    PHYSICAL EXAM:  General: Pt appears well and is in NAD  Neck: General: Supple without adenopathy or carotid bruits. Thyroid: Thyroid size normal.  No goiter or nodules appreciated.   Lungs: Clear with good BS bilat   Heart: RRR   Abdomen:  soft, nontender  Extremities:  Lower extremities - No pretibial edema.   Neuro: MS is good with appropriate affect, pt is alert and Ox3    DM foot exam: 08/05/2023  The skin of the feet is intact without sores or ulcerations. The pedal pulses are 2+ on right and 2+ on left. The  sensation is intact to a screening 5.07, 10 gram monofilament bilaterally   DATA REVIEWED:  Lab Results  Component Value Date   HGBA1C 7.1 (A) 03/14/2023   HGBA1C 7.3 (A) 09/13/2022  HGBA1C 7.3 (A) 06/07/2022    Latest Reference Range & Units 03/14/23 10:15  Sodium 135 - 146 mmol/L 137  Potassium 3.5 - 5.3 mmol/L 3.9  Chloride 98 - 110 mmol/L 104  CO2 20 - 32 mmol/L 28  Glucose 65 - 139 mg/dL 161 (H)  BUN 7 - 25 mg/dL 9  Creatinine 0.96 - 0.45 mg/dL 4.09  Calcium 8.6 - 81.1 mg/dL 9.3  BUN/Creatinine Ratio 6 - 22 (calc) SEE NOTE:  eGFR > OR = 60 mL/min/1.80m2 106  AG Ratio 1.0 - 2.5 (calc) 1.4  AST 10 - 30 U/L 13  ALT 6 - 29 U/L 7  Total Protein 6.1 - 8.1 g/dL 7.3  Total Bilirubin 0.2 - 1.2 mg/dL 0.8  Total CHOL/HDL Ratio <5.0 (calc) 3.1  Cholesterol <200 mg/dL 914 (H)  HDL Cholesterol > OR = 50 mg/dL 67  LDL Cholesterol (Calc) mg/dL (calc) 782 (H)  MICROALB/CREAT RATIO <30 mg/g creat 3  Non-HDL Cholesterol (Calc) <130 mg/dL (calc) 956 (H)  Triglycerides <150 mg/dL 92  (H): Data is abnormally high  ASSESSMENT / PLAN / RECOMMENDATIONS:   1) Type 1 Diabetes Mellitus, Optimally controlled, Without complications - Most recent A1c of 7.0 %. Goal A1c < 7.0 %.    Plan: GENERAL: I have discussed with the patient the pathophysiology of diabetes. We went over the natural progression of the disease. We stressed the importance of lifestyle changes. I explained the complications associated with diabetes including retinopathy, nephropathy, neuropathy as well as increased risk of cardiovascular disease. We went over the benefit seen with glycemic control.   I explained to the patient that diabetic patients are at higher than normal risk for amputations.  No hypoglycemia We have not been able to download pump/CGM, patient advised to contact customer service to help with this issue, in the meantime I have refilled Dexcom G7 as well as NovoLog  MEDICATIONS: NovoLog  EDUCATION /  INSTRUCTIONS: BG monitoring instructions: Patient is instructed to check her blood sugars 3 times a day. Call [facility] if: BG persistently < 70  I reviewed the Rule of 15 for the treatment of hypoglycemia in detail with the patient. Literature supplied.   2) Diabetic complications:  Eye: Does not have known diabetic retinopathy.  Neuro/ Feet: Does not have known diabetic peripheral neuropathy. Renal: Patient does not have known baseline CKD. She is not on an ACEI/ARB at present.  3) Left foot tendonitis :   - Exam is benign today  - Will refer to podiatry for further evaluation    F/U in 4 months     Signed electronically by: Lyndle Herrlich, MD  [facility]  [facility] [address] [place] [place], [place] [address] Phone: [phone] FAX: [phone]   CC: Patient, No Pcp Per No address on file Phone: None  Fax: None    Return to Endocrinology clinic as below: No future appointments.

## 2023-08-05 ENCOUNTER — Encounter: Payer: Self-pay | Admitting: Internal Medicine

## 2023-08-05 ENCOUNTER — Ambulatory Visit: Payer: No Typology Code available for payment source | Admitting: Internal Medicine

## 2023-08-05 VITALS — BP 110/70 | HR 89 | Ht 58.5 in | Wt 140.0 lb

## 2023-08-05 DIAGNOSIS — E109 Type 1 diabetes mellitus without complications: Secondary | ICD-10-CM

## 2023-08-05 DIAGNOSIS — Z4681 Encounter for fitting and adjustment of insulin pump: Secondary | ICD-10-CM | POA: Diagnosis not present

## 2023-08-05 DIAGNOSIS — M79672 Pain in left foot: Secondary | ICD-10-CM

## 2023-08-05 LAB — POCT GLYCOSYLATED HEMOGLOBIN (HGB A1C): Hemoglobin A1C: 6.9 % — AB (ref 4.0–5.6)

## 2023-08-05 MED ORDER — INSULIN ASPART 100 UNIT/ML IJ SOLN: INTRAMUSCULAR | 5 refills | Status: DC

## 2023-08-05 MED ORDER — DEXCOM G7 SENSOR MISC: 1.0000 | 3 refills | Status: AC

## 2023-08-05 NOTE — Patient Instructions (Signed)

## 2023-08-19 ENCOUNTER — Telehealth: Payer: Self-pay | Admitting: Internal Medicine

## 2023-08-19 NOTE — Telephone Encounter (Signed)
 Patient came into office and is requesting that send the requests for the Dexcom G7 kit (sensors, transmitters, etc) and T-Slim pump and supplies to [email or web address]  Patient is also asking or price comparison on G6 versus G-7  Patient contact number is [phone]

## 2023-08-22 ENCOUNTER — Other Ambulatory Visit (HOSPITAL_COMMUNITY): Payer: Self-pay

## 2023-08-22 ENCOUNTER — Telehealth: Payer: Self-pay

## 2023-08-22 NOTE — Telephone Encounter (Signed)
*  Endo  Pharmacy Patient Advocate Encounter   Received notification from CoverMyMeds that prior authorization for Dexcom G7 Sensor  is required/requested.   Insurance verification completed.   The patient is insured through [employer] .   Per test claim: PA required; PA submitted to above mentioned insurance via CoverMyMeds Key/confirmation #/EOC [facility] Status is pending

## 2023-08-26 NOTE — Telephone Encounter (Signed)
 Request still pending through [facility]

## 2023-09-06 ENCOUNTER — Encounter (INDEPENDENT_AMBULATORY_CARE_PROVIDER_SITE_OTHER): Payer: Self-pay

## 2023-09-19 ENCOUNTER — Encounter (INDEPENDENT_AMBULATORY_CARE_PROVIDER_SITE_OTHER): Payer: Self-pay

## 2023-09-23 ENCOUNTER — Telehealth: Payer: Self-pay

## 2023-09-23 ENCOUNTER — Other Ambulatory Visit (HOSPITAL_COMMUNITY): Payer: Self-pay

## 2023-09-23 MED ORDER — INSULIN LISPRO 100 UNIT/ML IJ SOLN
INTRAMUSCULAR | 5 refills | Status: DC
Start: 2023-09-23 — End: 2023-12-12

## 2023-09-23 NOTE — Addendum Note (Signed)
 Addended by: Hall Levering on: 09/23/2023 01:11 PM   Modules accepted: Orders

## 2023-09-23 NOTE — Telephone Encounter (Signed)
Novolog needs PA

## 2023-09-23 NOTE — Telephone Encounter (Signed)
 Pharmacy Patient Advocate Encounter   Received notification from Pt Calls Messages that prior authorization for Novolog  is required/requested.   Insurance verification completed.   The patient is insured through [employer] .   Per test claim:  humalog  is preferred by the insurance.  If suggested medication is appropriate, Please send in a new RX and discontinue this one. If not, please advise as to why it's not appropriate so that we may request a Prior Authorization. Please note, some preferred medications may still require a PA.  If the suggested medications have not been trialed and there are no contraindications to their use, the PA will not be submitted, as it will not be approved.   Also, insurance will only cover a 30 day supply per fill

## 2023-10-05 ENCOUNTER — Encounter: Payer: Self-pay | Admitting: Internal Medicine

## 2023-10-06 NOTE — Telephone Encounter (Signed)
 Pharmacy Patient Advocate Encounter  Received notification from [employer] that Prior Authorization for Dexcom G7 Sensor has been APPROVED from 08-22-2023 to 08-25-2024   PA #/Case ID/Reference #: [id number]

## 2023-12-12 ENCOUNTER — Ambulatory Visit: Admitting: Internal Medicine

## 2023-12-12 ENCOUNTER — Encounter: Payer: Self-pay | Admitting: Internal Medicine

## 2023-12-12 ENCOUNTER — Telehealth: Payer: Self-pay | Admitting: Internal Medicine

## 2023-12-12 VITALS — BP 126/80 | HR 84 | Ht 58.5 in | Wt 136.0 lb

## 2023-12-12 DIAGNOSIS — E109 Type 1 diabetes mellitus without complications: Secondary | ICD-10-CM

## 2023-12-12 DIAGNOSIS — E1065 Type 1 diabetes mellitus with hyperglycemia: Secondary | ICD-10-CM

## 2023-12-12 LAB — POCT GLYCOSYLATED HEMOGLOBIN (HGB A1C): Hemoglobin A1C: 8 % — AB (ref 4.0–5.6)

## 2023-12-12 MED ORDER — INSULIN LISPRO 100 UNIT/ML IJ SOLN: INTRAMUSCULAR | 3 refills | Status: DC

## 2023-12-12 NOTE — Telephone Encounter (Signed)
 Patient had an appointement this morning and forgot to ask for insulin  refills,is completely out. send new RX to  [facility] on [address] [place]

## 2023-12-12 NOTE — Progress Notes (Signed)
 Name: [name]  MRN/ DOB: [id number], 09-May-2001   Age/ Sex: 23 y.o., female    PCP: Patient, No Pcp Per   Reason for Endocrinology Evaluation: Type 1 Diabetes Mellitus     Date of Initial Endocrinology Visit: 08/05/2023    PATIENT IDENTIFIER: [name] is a 23 y.o. female with a past medical history of DM. The patient presented for initial endocrinology clinic visit on 08/05/2023 for consultative assistance with her diabetes management.    HPI: [name] is accompanied by her mother and fianc   Diagnosed with DM in 2016        Hemoglobin A1c has ranged from 5.7% in 2022, peaking at 7.3% in 2024.   On her initial visit to our clinic, her A1c was 7.0%, she was on the tandem pump, we were unable to download it on her first visit, no changes were made   SUBJECTIVE:   During the last visit (08/05/2023): A1c 7.0%      Today (12/12/23): [name] is here for follow-up on diabetes management.  She checks blood sugars multiple times daily. The patient has not had hypoglycemic episodes since the last clinic visit.    She is accompanied by her father today  Denies nausea or vomiting  No changes in bowel movements    This patient with type 1 diabetes is treated with T:Slim  (insulin  pump). During the visit the pump basal and bolus doses were reviewed including carb/insulin  rations and supplemental doses. The clinical list was updated. The glucose meter download was reviewed in detail to determine if the current pump settings are providing the best glycemic control without excessive hypoglycemia.  Pump and meter download:    Pump   Tandem  Settings  I:C  SF Goal  Insulin  type   Novolog       Basal rate          0000 0.75 u/h  10 45 120   0600 0.9 8 45    1300 1.0 u/h  8 38    1700  0.95 u/h  7.0 38    2200 0.95 9 40                 Type & Model of Pump: tandem Insulin  Type: Currently using Novolog  .  Body mass index is 27.94 kg/m.  PUMP  STATISTICS: Unable to download     HOME DIABETES REGIMEN: Novolog     Statin: no ACE-I/ARB: no Prior Diabetic Education: yes   CONTINUOUS GLUCOSE MONITORING RECORD INTERPRETATION    Dates of Recording: 7/1 - 12/12/2023  Sensor description: Dexcom  Results statistics:   CGM use % of time 93  Average and SD 212/65  Time in range 32%  % Time Above 180 41  % Time above 250 27  % Time Below target 0   Glycemic patterns summary: BG's remain elevated throughout the night and most of the day  Hyperglycemic episodes overnight  Hypoglycemic episodes occurred N/A  Overnight periods: High    DIABETIC COMPLICATIONS: Microvascular complications:   Denies: Neuropathy, retinopathy,  Last eye exam: Completed 2024  Macrovascular complications:   Denies: CAD, PVD, CVA   PAST HISTORY: Past Medical History:  Past Medical History:  Diagnosis Date   Diabetes mellitus without complication (HCC)    Eczema    Past Surgical History:  Past Surgical History:  Procedure Laterality Date   WISDOM TOOTH EXTRACTION      Social History:  reports that she has never smoked. She has  never used smokeless tobacco. She reports that she does not drink alcohol and does not use drugs. Family History:  Family History  Problem Relation Age of Onset   Graves' disease Mother    Hypertension Mother    Hypertension Father      HOME MEDICATIONS: Allergies as of 12/12/2023       Reactions   Apple Fiber Anaphylaxis   Swelling of face and throat when eats apple peelings   Apple Fruit Extract Other (See Comments)   Cherry    Throat and lip swelling         Medication List        Accurate as of December 12, 2023  8:10 AM. If you have any questions, ask your nurse or doctor.          acetone (urine) test strip 1 strip by Does not apply route as needed for high blood sugar.   BD Pen Needle Nano 2nd Gen 32G X 4 MM Misc Generic drug: Insulin  Pen Needle USE TO INJECT INSULIN  AS  DIRECTED.   benzonatate  100 MG capsule Commonly known as: TESSALON  Take 1 capsule (100 mg total) by mouth 3 (three) times daily as needed for cough.   cholecalciferol 25 MCG (1000 UNIT) tablet Commonly known as: VITAMIN D3 Take 1,000 Units by mouth daily.   Dexcom G7 Sensor Misc 1 Device by Does not apply route as directed.   diclofenac  75 MG EC tablet Commonly known as: VOLTAREN  Take 1 tablet (75 mg total) by mouth 2 (two) times daily.   Gvoke HypoPen  2-Pack 1 MG/0.2ML Soaj Generic drug: Glucagon  Inject 1 Dose into the skin as needed.   insulin  aspart 100 UNIT/ML injection Commonly known as: NovoLOG  INJECT UP TO 60 UNITS INTO PUMP EVERY day   insulin  lispro 100 UNIT/ML injection Commonly known as: HumaLOG  INJECT UP TO 60 UNITS INTO PUMP EVERY DAY   ONE TOUCH LANCETS Misc Use to check blood sugar up to six times a day.   OneTouch Verio Flex System w/Device Kit Use to check glucose   OneTouch Verio test strip Generic drug: glucose blood CHECK BLOOD SUGAR 6 TIMES DAILY.   silver  sulfADIAZINE  1 % cream Commonly known as: SILVADENE  Apply 1 Application topically 2 (two) times daily.   triamcinolone  0.025 % ointment Commonly known as: KENALOG  Apply 1 Application topically 2 (two) times daily. 1 application twice daily, do not use for longer than 7 days in a row as this can cause skin thinning.         ALLERGIES: Allergies  Allergen Reactions   Apple Fiber Anaphylaxis    Swelling of face and throat when eats apple peelings   Apple Fruit Extract Other (See Comments)   Cherry     Throat and lip swelling      REVIEW OF SYSTEMS: A comprehensive ROS was conducted with the patient and is negative except as per HPI     OBJECTIVE:   VITAL SIGNS: BP 126/80 (BP Location: Left Arm, Patient Position: Sitting, Cuff Size: Normal)   Pulse 84   Ht 4' 10.5 (1.486 m)   Wt 136 lb (61.7 kg)   BMI 27.94 kg/m    PHYSICAL EXAM:  General: Pt appears well and is in NAD   Neck: General: Supple without adenopathy or carotid bruits. Thyroid : Thyroid  size normal.  No goiter or nodules appreciated.   Lungs: Clear with good BS bilat   Heart: RRR   Abdomen:  soft, nontender  Extremities:  Lower extremities -  No pretibial edema.   Neuro: MS is good with appropriate affect, pt is alert and Ox3    DM foot exam: 08/05/2023  The skin of the feet is intact without sores or ulcerations. The pedal pulses are 2+ on right and 2+ on left. The sensation is intact to a screening 5.07, 10 gram monofilament bilaterally   DATA REVIEWED:  Lab Results  Component Value Date   HGBA1C 6.9 (A) 08/05/2023   HGBA1C 7.1 (A) 03/14/2023   HGBA1C 7.3 (A) 09/13/2022    Latest Reference Range & Units 03/14/23 10:15  Sodium 135 - 146 mmol/L 137  Potassium 3.5 - 5.3 mmol/L 3.9  Chloride 98 - 110 mmol/L 104  CO2 20 - 32 mmol/L 28  Glucose 65 - 139 mg/dL 855 (H)  BUN 7 - 25 mg/dL 9  Creatinine 9.49 - 9.03 mg/dL 9.19  Calcium 8.6 - 89.7 mg/dL 9.3  BUN/Creatinine Ratio 6 - 22 (calc) SEE NOTE:  eGFR > OR = 60 mL/min/1.22m2 106  AG Ratio 1.0 - 2.5 (calc) 1.4  AST 10 - 30 U/L 13  ALT 6 - 29 U/L 7  Total Protein 6.1 - 8.1 g/dL 7.3  Total Bilirubin 0.2 - 1.2 mg/dL 0.8  Total CHOL/HDL Ratio <5.0 (calc) 3.1  Cholesterol <200 mg/dL 792 (H)  HDL Cholesterol > OR = 50 mg/dL 67  LDL Cholesterol (Calc) mg/dL (calc) 879 (H)  MICROALB/CREAT RATIO <30 mg/g creat 3  Non-HDL Cholesterol (Calc) <130 mg/dL (calc) 859 (H)  Triglycerides <150 mg/dL 92  (H): Data is abnormally high  ASSESSMENT / PLAN / RECOMMENDATIONS:   1) Type 1 Diabetes Mellitus, Optimally controlled, Without complications - Most recent A1c of 8.0 %. Goal A1c < 7.0 %.   - A1c has trended up from 7.0% to 8.0% -We have not been able to download pump again today.  Patient tells me she is due for an upgrade -In reviewing CGM, patient has been noted with hypoglycemia during the night.  She assures me compliance with carb  counting -I will increase basal rate as well as insulin  to carb ratio as below    MEDICATIONS: NovoLog      Pump   Tandem  Settings  I:C  SF Goal  Insulin  type   Novolog       Basal rate          0000 0.8 u/h  10 40 120   0600 0.95 7 40    1300 1.0 u/h  7 38    1700  0.95 u/h  6 38    2200 0.95 9 40                EDUCATION / INSTRUCTIONS: BG monitoring instructions: Patient is instructed to check her blood sugars 3 times a day. Call [facility] if: BG persistently < 70  I reviewed the Rule of 15 for the treatment of hypoglycemia in detail with the patient. Literature supplied.   2) Diabetic complications:  Eye: Does not have known diabetic retinopathy.  Neuro/ Feet: Does not have known diabetic peripheral neuropathy. Renal: Patient does not have known baseline CKD. She is not on an ACEI/ARB at present.    F/U in 4 months   I spent 25 minutes preparing to see the patient by review of recent labs, imaging and procedures, obtaining and reviewing separately obtained history, communicating with the patient/family or caregiver, ordering medications, tests or procedures, and documenting clinical information in the EHR including the differential Dx, treatment, and any  further evaluation and other management    Signed electronically by: Stefano Redgie Butts, MD  [facility]  [facility] [address] [place], [place] [address] Phone: [phone] FAX: [phone]   CC: Patient, No Pcp Per No address on file Phone: None  Fax: None    Return to Endocrinology clinic as below: No future appointments.

## 2023-12-12 NOTE — Patient Instructions (Signed)

## 2023-12-12 NOTE — Telephone Encounter (Signed)
 Patient also was asking about the quantity of insulin  she is receiving at one time,it use to be 3-4 vials and now it's only 2 at a time.Please advise.

## 2024-02-18 ENCOUNTER — Encounter: Payer: Self-pay | Admitting: Internal Medicine

## 2024-02-20 MED ORDER — INSULIN LISPRO 100 UNIT/ML IJ SOLN: INTRAMUSCULAR | 3 refills | Status: AC

## 2024-03-19 ENCOUNTER — Ambulatory Visit: Admitting: Internal Medicine

## 2024-03-19 NOTE — Progress Notes (Deleted)
 Name: [name]  MRN/ DOB: [id number], [birth year]   Age/ Sex: 23 y.o., female    PCP: Patient, No Pcp Per   Reason for Endocrinology Evaluation: Type 1 Diabetes Mellitus     Date of Initial Endocrinology Visit: 08/05/2023    PATIENT IDENTIFIER: [name] is a 23 y.o. female with a past medical history of DM. The patient presented for initial endocrinology clinic visit on 08/05/2023 for consultative assistance with her diabetes management.    HPI: [name] is accompanied by her mother and fianc   Diagnosed with DM in 2016        Hemoglobin A1c has ranged from 5.7% in 2022, peaking at 7.3% in 2024.   On her initial visit to our clinic, her A1c was 7.0%, she was on the tandem pump, we were unable to download it on her first visit, no changes were made   SUBJECTIVE:   During the last visit (12/12/2023): A1c 8.0%      Today (03/19/24): [name] is here for follow-up on diabetes management.  She checks blood sugars multiple times daily. The patient has not had hypoglycemic episodes since the last clinic visit.    She is accompanied by her father today  Denies nausea or vomiting  No changes in bowel movements    This patient with type 1 diabetes is treated with T:Slim  (insulin  pump). During the visit the pump basal and bolus doses were reviewed including carb/insulin  rations and supplemental doses. The clinical list was updated. The glucose meter download was reviewed in detail to determine if the current pump settings are providing the best glycemic control without excessive hypoglycemia.  Pump and meter download:     Pump   Tandem  Settings  I:C  SF Goal  Insulin  type   Novolog       Basal rate          0000 0.8 u/h  10 40 120   0600 0.95 7 40    1300 1.0 u/h  7 38    1700  0.95 u/h  6 38    2200 0.95 9 40                 Type & Model of Pump: tandem Insulin  Type: Currently using Novolog  .  There is no height or weight on file to calculate  BMI.  PUMP STATISTICS: Unable to download     HOME DIABETES REGIMEN: Novolog     Statin: no ACE-I/ARB: no Prior Diabetic Education: yes   CONTINUOUS GLUCOSE MONITORING RECORD INTERPRETATION    Dates of Recording: 7/1 - 12/12/2023  Sensor description: Dexcom  Results statistics:   CGM use % of time 93  Average and SD 212/65  Time in range 32%  % Time Above 180 41  % Time above 250 27  % Time Below target 0   Glycemic patterns summary: BG's remain elevated throughout the night and most of the day  Hyperglycemic episodes overnight  Hypoglycemic episodes occurred N/A  Overnight periods: High    DIABETIC COMPLICATIONS: Microvascular complications:   Denies: Neuropathy, retinopathy,  Last eye exam: Completed 2024  Macrovascular complications:   Denies: CAD, PVD, CVA   PAST HISTORY: Past Medical History:  Past Medical History:  Diagnosis Date   Diabetes mellitus without complication (HCC)    Eczema    Past Surgical History:  Past Surgical History:  Procedure Laterality Date   WISDOM TOOTH EXTRACTION      Social History:  reports that  she has never smoked. She has never used smokeless tobacco. She reports that she does not drink alcohol and does not use drugs. Family History:  Family History  Problem Relation Age of Onset   Graves' disease Mother    Hypertension Mother    Hypertension Father      HOME MEDICATIONS: Allergies as of 03/19/2024       Reactions   Apple Fiber Anaphylaxis   Swelling of face and throat when eats apple peelings   Apple Fruit Extract Other (See Comments)   Cherry    Throat and lip swelling         Medication List        Accurate as of March 19, 2024  7:21 AM. If you have any questions, ask your nurse or doctor.          benzonatate  100 MG capsule Commonly known as: TESSALON  Take 1 capsule (100 mg total) by mouth 3 (three) times daily as needed for cough.   cholecalciferol 25 MCG (1000 UNIT)  tablet Commonly known as: VITAMIN D3 Take 1,000 Units by mouth daily.   Dexcom G7 Sensor Misc 1 Device by Does not apply route as directed.   Gvoke HypoPen  2-Pack 1 MG/0.2ML Soaj Generic drug: Glucagon  Inject 1 Dose into the skin as needed.   insulin  lispro 100 UNIT/ML injection Commonly known as: HumaLOG  INJECT UP TO 150 UNITS INTO PUMP EVERY DAY   ONE TOUCH LANCETS Misc Use to check blood sugar up to six times a day.   OneTouch Verio Flex System w/Device Kit Use to check glucose   OneTouch Verio test strip Generic drug: glucose blood CHECK BLOOD SUGAR 6 TIMES DAILY.   silver  sulfADIAZINE  1 % cream Commonly known as: SILVADENE  Apply 1 Application topically 2 (two) times daily.   triamcinolone  0.025 % ointment Commonly known as: KENALOG  Apply 1 Application topically 2 (two) times daily. 1 application twice daily, do not use for longer than 7 days in a row as this can cause skin thinning.         ALLERGIES: Allergies  Allergen Reactions   Apple Fiber Anaphylaxis    Swelling of face and throat when eats apple peelings   Apple Fruit Extract Other (See Comments)   Cherry     Throat and lip swelling      REVIEW OF SYSTEMS: A comprehensive ROS was conducted with the patient and is negative except as per HPI     OBJECTIVE:   VITAL SIGNS: There were no vitals taken for this visit.   PHYSICAL EXAM:  General: Pt appears well and is in NAD  Neck: General: Supple without adenopathy or carotid bruits. Thyroid : Thyroid  size normal.  No goiter or nodules appreciated.   Lungs: Clear with good BS bilat   Heart: RRR   Abdomen:  soft, nontender  Extremities:  Lower extremities - No pretibial edema.   Neuro: MS is good with appropriate affect, pt is alert and Ox3    DM foot exam: 08/05/2023  The skin of the feet is intact without sores or ulcerations. The pedal pulses are 2+ on right and 2+ on left. The sensation is intact to a screening 5.07, 10 gram monofilament  bilaterally   DATA REVIEWED:  Lab Results  Component Value Date   HGBA1C 8.0 (A) 12/12/2023   HGBA1C 6.9 (A) 08/05/2023   HGBA1C 7.1 (A) 03/14/2023    Latest Reference Range & Units 03/14/23 10:15  Sodium 135 - 146 mmol/L 137  Potassium  3.5 - 5.3 mmol/L 3.9  Chloride 98 - 110 mmol/L 104  CO2 20 - 32 mmol/L 28  Glucose 65 - 139 mg/dL 855 (H)  BUN 7 - 25 mg/dL 9  Creatinine 9.49 - 9.03 mg/dL 9.19  Calcium 8.6 - 89.7 mg/dL 9.3  BUN/Creatinine Ratio 6 - 22 (calc) SEE NOTE:  eGFR > OR = 60 mL/min/1.39m2 106  AG Ratio 1.0 - 2.5 (calc) 1.4  AST 10 - 30 U/L 13  ALT 6 - 29 U/L 7  Total Protein 6.1 - 8.1 g/dL 7.3  Total Bilirubin 0.2 - 1.2 mg/dL 0.8  Total CHOL/HDL Ratio <5.0 (calc) 3.1  Cholesterol <200 mg/dL 792 (H)  HDL Cholesterol > OR = 50 mg/dL 67  LDL Cholesterol (Calc) mg/dL (calc) 879 (H)  MICROALB/CREAT RATIO <30 mg/g creat 3  Non-HDL Cholesterol (Calc) <130 mg/dL (calc) 859 (H)  Triglycerides <150 mg/dL 92  (H): Data is abnormally high  ASSESSMENT / PLAN / RECOMMENDATIONS:   1) Type 1 Diabetes Mellitus, Optimally controlled, Without complications - Most recent A1c of 8.0 %. Goal A1c < 7.0 %.   - A1c has trended up from 7.0% to 8.0% -We have not been able to download pump again today.  Patient tells me she is due for an upgrade -In reviewing CGM, patient has been noted with hypoglycemia during the night.  She assures me compliance with carb counting -I will increase basal rate as well as insulin  to carb ratio as below    MEDICATIONS: NovoLog      Pump   Tandem  Settings  I:C  SF Goal  Insulin  type   Novolog       Basal rate          0000 0.8 u/h  10 40 120   0600 0.95 7 40    1300 1.0 u/h  7 38    1700  0.95 u/h  6 38    2200 0.95 9 40                EDUCATION / INSTRUCTIONS: BG monitoring instructions: Patient is instructed to check her blood sugars 3 times a day. Call [facility] if: BG persistently < 70  I reviewed the Rule of 15  for the treatment of hypoglycemia in detail with the patient. Literature supplied.   2) Diabetic complications:  Eye: Does not have known diabetic retinopathy.  Neuro/ Feet: Does not have known diabetic peripheral neuropathy. Renal: Patient does not have known baseline CKD. She is not on an ACEI/ARB at present.    F/U in 4 months     Signed electronically by: Stefano Redgie Butts, MD  [facility] Endocrinology  [facility] [address] [place], [place] [address] Phone: [phone] FAX: [phone]   CC: Patient, No Pcp Per No address on file Phone: None  Fax: None    Return to Endocrinology clinic as below: Future Appointments  Date Time Provider Department Center  03/19/2024 11:10 AM Jermiah Soderman, Donell Redgie, MD [facility] None

## 2024-03-20 ENCOUNTER — Encounter: Payer: Self-pay | Admitting: Internal Medicine

## 2024-03-20 ENCOUNTER — Telehealth: Payer: Self-pay

## 2024-03-20 ENCOUNTER — Ambulatory Visit (INDEPENDENT_AMBULATORY_CARE_PROVIDER_SITE_OTHER): Admitting: Internal Medicine

## 2024-03-20 ENCOUNTER — Other Ambulatory Visit

## 2024-03-20 VITALS — HR 77 | Ht 58.5 in | Wt 145.0 lb

## 2024-03-20 DIAGNOSIS — Z23 Encounter for immunization: Secondary | ICD-10-CM

## 2024-03-20 DIAGNOSIS — R21 Rash and other nonspecific skin eruption: Secondary | ICD-10-CM

## 2024-03-20 DIAGNOSIS — E010 Iodine-deficiency related diffuse (endemic) goiter: Secondary | ICD-10-CM

## 2024-03-20 DIAGNOSIS — E1065 Type 1 diabetes mellitus with hyperglycemia: Secondary | ICD-10-CM

## 2024-03-20 LAB — POCT GLYCOSYLATED HEMOGLOBIN (HGB A1C): Hemoglobin A1C: 6.7 % — AB (ref 4.0–5.6)

## 2024-03-20 NOTE — Progress Notes (Signed)
 Name: [name]  MRN/ DOB: [id number], Aug 12, 2000   Age/ Sex: 23 y.o., female    PCP: Patient, No Pcp Per   Reason for Endocrinology Evaluation: Type 1 Diabetes Mellitus     Date of Initial Endocrinology Visit: 08/05/2023    PATIENT IDENTIFIER: [name] is a 23 y.o. female with a past medical history of DM. The patient presented for initial endocrinology clinic visit on 08/05/2023 for consultative assistance with her diabetes management.    HPI: [name] is accompanied by her mother and fianc   Diagnosed with DM in 2016        Hemoglobin A1c has ranged from 5.7% in 2022, peaking at 7.3% in 2024.   On her initial visit to our clinic, her A1c was 7.0%, she was on the tandem pump, we were unable to download it on her first visit, no changes were made   SUBJECTIVE:   During the last visit (12/12/2023): A1c 8.0%      Today (03/20/24): [name] is here for follow-up on diabetes management.  She checks blood sugars multiple times daily. The patient has had hypoglycemic episodes since the last clinic visit.   She is accompanied by her mother in-law No nausea or vomiting  No constipation or diarrhea  She has been following up with Gynecology  LMP last week - 10/15 Patient has history of eczema, but she did notice a neck rash, the rash is pruritic with sweating, she does wear a neck chain  This patient with type 1 diabetes is treated with T:Slim  (insulin  pump). During the visit the pump basal and bolus doses were reviewed including carb/insulin  rations and supplemental doses. The clinical list was updated. The glucose meter download was reviewed in detail to determine if the current pump settings are providing the best glycemic control without excessive hypoglycemia.  Pump and meter download:     Pump   Tandem  Settings  I:C  SF Goal  Insulin  type   Novolog       Basal rate          0000 0.8 u/h  10 40 120   0600 0.95 7 40    1300 1.0 u/h  7 38    1700  0.95  u/h  6 38    2200 0.95 9 40                 Type & Model of Pump: tandem Insulin  Type: Currently using Novolog  .  Body mass index is 29.79 kg/m.  PUMP STATISTICS:   Average BG: 153  Average Daily Carbs (g): 191  Average Total Daily Insulin : 59.37  Average Daily Basal: 21.74 (37 %) Average Daily Bolus: 37.63 (63 %)     HOME DIABETES REGIMEN: Novolog     Statin: no ACE-I/ARB: no Prior Diabetic Education: yes   CONTINUOUS GLUCOSE MONITORING RECORD INTERPRETATION    Dates of Recording: 10/8-10/21/2025  Sensor description: Dexcom  Results statistics:   CGM use % of time 94  Average and SD 153/51  Time in range 73%  % Time Above 180 20  % Time above 250 5  % Time Below target 2   Glycemic patterns summary: BGs are optimal overnight and fluctuate during the day  Hyperglycemic episodes postprandial  Hypoglycemic episodes occurred during the day  Overnight periods: Mostly optimal    DIABETIC COMPLICATIONS: Microvascular complications:   Denies: Neuropathy, retinopathy,  Last eye exam: Completed 2024  Macrovascular complications:   Denies: CAD, PVD, CVA  PAST HISTORY: Past Medical History:  Past Medical History:  Diagnosis Date   Diabetes mellitus without complication (HCC)    Eczema    Past Surgical History:  Past Surgical History:  Procedure Laterality Date   WISDOM TOOTH EXTRACTION      Social History:  reports that she has never smoked. She has never used smokeless tobacco. She reports that she does not drink alcohol and does not use drugs. Family History:  Family History  Problem Relation Age of Onset   Graves' disease Mother    Hypertension Mother    Hypertension Father      HOME MEDICATIONS: Allergies as of 03/20/2024       Reactions   Apple Fiber Anaphylaxis   Swelling of face and throat when eats apple peelings   Apple Fruit Extract Other (See Comments)   Cherry    Throat and lip swelling         Medication List         Accurate as of March 20, 2024  8:34 AM. If you have any questions, ask your nurse or doctor.          benzonatate  100 MG capsule Commonly known as: TESSALON  Take 1 capsule (100 mg total) by mouth 3 (three) times daily as needed for cough.   cholecalciferol 25 MCG (1000 UNIT) tablet Commonly known as: VITAMIN D3 Take 1,000 Units by mouth daily.   Dexcom G7 Sensor Misc 1 Device by Does not apply route as directed.   Gvoke HypoPen  2-Pack 1 MG/0.2ML Soaj Generic drug: Glucagon  Inject 1 Dose into the skin as needed.   insulin  lispro 100 UNIT/ML injection Commonly known as: HumaLOG  INJECT UP TO 150 UNITS INTO PUMP EVERY DAY   ONE TOUCH LANCETS Misc Use to check blood sugar up to six times a day.   OneTouch Verio Flex System w/Device Kit Use to check glucose   OneTouch Verio test strip Generic drug: glucose blood CHECK BLOOD SUGAR 6 TIMES DAILY.   silver  sulfADIAZINE  1 % cream Commonly known as: SILVADENE  Apply 1 Application topically 2 (two) times daily.   triamcinolone  0.025 % ointment Commonly known as: KENALOG  Apply 1 Application topically 2 (two) times daily. 1 application twice daily, do not use for longer than 7 days in a row as this can cause skin thinning.         ALLERGIES: Allergies  Allergen Reactions   Apple Fiber Anaphylaxis    Swelling of face and throat when eats apple peelings   Apple Fruit Extract Other (See Comments)   Cherry     Throat and lip swelling      REVIEW OF SYSTEMS: A comprehensive ROS was conducted with the patient and is negative except as per HPI     OBJECTIVE:   VITAL SIGNS: Pulse 77   Ht 4' 10.5 (1.486 m)   Wt 145 lb (65.8 kg)   SpO2 99%   BMI 29.79 kg/m    PHYSICAL EXAM:  General: Pt appears well and is in NAD  Neck: General: Supple without adenopathy or carotid bruits. Thyroid : Thyroid  size normal.  No goiter or nodules appreciated.   Lungs: Clear with good BS bilat   Heart: RRR   Abdomen:  soft,  nontender  Extremities:  Lower extremities - No pretibial edema.   Neuro: MS is good with appropriate affect, pt is alert and Ox3    DM foot exam: 08/05/2023  The skin of the feet is intact without sores or ulcerations. The  pedal pulses are 2+ on right and 2+ on left. The sensation is intact to a screening 5.07, 10 gram monofilament bilaterally   DATA REVIEWED:  Lab Results  Component Value Date   HGBA1C 6.7 (A) 03/20/2024   HGBA1C 8.0 (A) 12/12/2023   HGBA1C 6.9 (A) 08/05/2023    Latest Reference Range & Units 03/14/23 10:15  Sodium 135 - 146 mmol/L 137  Potassium 3.5 - 5.3 mmol/L 3.9  Chloride 98 - 110 mmol/L 104  CO2 20 - 32 mmol/L 28  Glucose 65 - 139 mg/dL 855 (H)  BUN 7 - 25 mg/dL 9  Creatinine 9.49 - 9.03 mg/dL 9.19  Calcium 8.6 - 89.7 mg/dL 9.3  BUN/Creatinine Ratio 6 - 22 (calc) SEE NOTE:  eGFR > OR = 60 mL/min/1.81m2 106  AG Ratio 1.0 - 2.5 (calc) 1.4  AST 10 - 30 U/L 13  ALT 6 - 29 U/L 7  Total Protein 6.1 - 8.1 g/dL 7.3  Total Bilirubin 0.2 - 1.2 mg/dL 0.8  Total CHOL/HDL Ratio <5.0 (calc) 3.1  Cholesterol <200 mg/dL 792 (H)  HDL Cholesterol > OR = 50 mg/dL 67  LDL Cholesterol (Calc) mg/dL (calc) 879 (H)  MICROALB/CREAT RATIO <30 mg/g creat 3  Non-HDL Cholesterol (Calc) <130 mg/dL (calc) 859 (H)  Triglycerides <150 mg/dL 92  (H): Data is abnormally high  ASSESSMENT / PLAN / RECOMMENDATIONS:   1) Type 1 Diabetes Mellitus, Optimally controlled, Without complications - Most recent A1c of 6.7%. Goal A1c < 7.0 %.      - A1c has optimized - Patient has been noted with hypoglycemia in the evenings , that she attributes to being physically active at work, we did discuss the option to switch to exercise mode - I will decrease her basal rate at 1 and 5 PM, I will also change her sensitivity factor and insulin  to carb ratio as she also has been noted with hypoglycemia post bolus - MA/CR ratio pending - Labs done at gynecologist office, will obtain  these  MEDICATIONS: NovoLog      Pump   Tandem  Settings  I:C  SF Goal  Insulin  type   Novolog       Basal rate          0000 0.8 u/h  10 40 120   0600 0.95 7 40    1300 0.95 u/h  8 40    1700  0.90 u/h  7 40    2200 0.95 9 40                EDUCATION / INSTRUCTIONS: BG monitoring instructions: Patient is instructed to check her blood sugars 3 times a day. Call [facility] if: BG persistently < 70  I reviewed the Rule of 15 for the treatment of hypoglycemia in detail with the patient. Literature supplied.   2) Diabetic complications:  Eye: Does not have known diabetic retinopathy.  Neuro/ Feet: Does not have known diabetic peripheral neuropathy. Renal: Patient does not have known baseline CKD. She is not on an ACEI/ARB at present.   3) Thyromegaly  - No local neck symptoms -Will proceed with thyroid  ultrasound -Per patient her TFTs were abnormal at gynecology, will obtain these records   4) Neck rash  - This is not typical of eczema -She does have a topical cream that she typically uses when needed -I did advise her to remove the neck chain and monitor the rash for the next 2-3 weeks as she may be allergic to  it   F/U in 6 months     Signed electronically by: Stefano Redgie Butts, MD  [facility] Endocr[facility]ology  [facility] [address] [place], [place] [address] Phone: [phone] FAX: [phone]   CC: Patient, No Pcp Per No address on file Phone: None  Fax: None    Return to Endocrinology clinic as below: Future Appointments  Date Time Provider Department Center  09/17/2024  8:10 AM Cedra Villalon, Donell Redgie, MD [facility] None

## 2024-03-20 NOTE — Patient Instructions (Signed)

## 2024-03-21 ENCOUNTER — Ambulatory Visit: Payer: Self-pay | Admitting: Internal Medicine

## 2024-03-21 ENCOUNTER — Other Ambulatory Visit

## 2024-03-21 LAB — MICROALBUMIN / CREATININE URINE RATIO
Creatinine, Urine: 49 mg/dL (ref 20–275)
Microalb, Ur: <0.2 mg/dL

## 2024-03-22 ENCOUNTER — Inpatient Hospital Stay: Admission: RE | Admit: 2024-03-22 | Discharge: 2024-03-22 | Attending: Internal Medicine | Admitting: Internal Medicine

## 2024-03-22 DIAGNOSIS — E010 Iodine-deficiency related diffuse (endemic) goiter: Secondary | ICD-10-CM

## 2024-03-22 NOTE — Telephone Encounter (Signed)
 Done

## 2024-09-17 ENCOUNTER — Ambulatory Visit: Admitting: Internal Medicine
